# Patient Record
Sex: Female | Born: 1990 | Race: White | Hispanic: No | Marital: Married | Smoking: Never smoker
Health system: Southern US, Community
[De-identification: ages and names within clinical notes are randomized; demographics above are authoritative.]

## PROBLEM LIST (undated history)

## (undated) DIAGNOSIS — K219 Gastro-esophageal reflux disease without esophagitis: Secondary | ICD-10-CM

## (undated) HISTORY — PX: TONSILLECTOMY AND ADENOIDECTOMY: SUR1326

## (undated) HISTORY — DX: Gastro-esophageal reflux disease without esophagitis: K21.9

---

## 2014-11-30 ENCOUNTER — Encounter: Payer: Self-pay | Admitting: Internal Medicine

## 2014-11-30 ENCOUNTER — Ambulatory Visit (INDEPENDENT_AMBULATORY_CARE_PROVIDER_SITE_OTHER): Payer: 59 | Admitting: Internal Medicine

## 2014-11-30 VITALS — BP 140/90 | HR 75 | Temp 97.8°F | Resp 18 | Ht 62.0 in | Wt 203.0 lb

## 2014-11-30 DIAGNOSIS — K219 Gastro-esophageal reflux disease without esophagitis: Secondary | ICD-10-CM | POA: Insufficient documentation

## 2014-11-30 DIAGNOSIS — M542 Cervicalgia: Secondary | ICD-10-CM

## 2014-11-30 DIAGNOSIS — Z0000 Encounter for general adult medical examination without abnormal findings: Secondary | ICD-10-CM

## 2014-11-30 DIAGNOSIS — E669 Obesity, unspecified: Secondary | ICD-10-CM

## 2014-11-30 MED ORDER — DICLOFENAC SODIUM 1 % TD GEL: 2.0000 g | Freq: Three times a day (TID) | TRANSDERMAL | Status: DC | PRN

## 2014-11-30 NOTE — Patient Instructions (Signed)
We have checked an EKG and your heart looks normal. We will send in a medicine that you can rub on the spots that hurt. It is called voltaren and you can use it 2-3 times per day if needed.   We will also have you work on stretching exercises for the muscles in your shoulder and neck to help you from getting more of these pains.   Shoulder Exercises EXERCISES  RANGE OF MOTION (ROM) AND STRETCHING EXERCISES These exercises may help you when beginning to rehabilitate your injury. Your symptoms may resolve with or without further involvement from your physician, physical therapist or athletic trainer. While completing these exercises, remember:   Restoring tissue flexibility helps normal motion to return to the joints. This allows healthier, less painful movement and activity.  An effective stretch should be held for at least 30 seconds.  A stretch should never be painful. You should only feel a gentle lengthening or release in the stretched tissue. ROM - Pendulum  Bend at the waist so that your right / left arm falls away from your body. Support yourself with your opposite hand on a solid surface, such as a table or a countertop.  Your right / left arm should be perpendicular to the ground. If it is not perpendicular, you need to lean over farther. Relax the muscles in your right / left arm and shoulder as much as possible.  Gently sway your hips and trunk so they move your right / left arm without any use of your right / left shoulder muscles.  Progress your movements so that your right / left arm moves side to side, then forward and backward, and finally, both clockwise and counterclockwise.  Complete __________ repetitions in each direction. Many people use this exercise to relieve discomfort in their shoulder as well as to gain range of motion. Repeat __________ times. Complete this exercise __________ times per day. STRETCH - Flexion, Standing  Stand with good posture. With an  underhand grip on your right / left hand and an overhand grip on the opposite hand, grasp a broomstick or cane so that your hands are a little more than shoulder-width apart.  Keeping your right / left elbow straight and shoulder muscles relaxed, push the stick with your opposite hand to raise your right / left arm in front of your body and then overhead. Raise your arm until you feel a stretch in your right / left shoulder, but before you have increased shoulder pain.  Try to avoid shrugging your right / left shoulder as your arm rises by keeping your shoulder blade tucked down and toward your mid-back spine. Hold __________ seconds.  Slowly return to the starting position. Repeat __________ times. Complete this exercise __________ times per day. STRETCH - Internal Rotation  Place your right / left hand behind your back, palm-up.  Throw a towel or belt over your opposite shoulder. Grasp the towel/belt with your right / left hand.  While keeping an upright posture, gently pull up on the towel/belt until you feel a stretch in the front of your right / left shoulder.  Avoid shrugging your right / left shoulder as your arm rises by keeping your shoulder blade tucked down and toward your mid-back spine.  Hold __________. Release the stretch by lowering your opposite hand. Repeat __________ times. Complete this exercise __________ times per day. STRETCH - External Rotation and Abduction  Stagger your stance through a doorframe. It does not matter which foot is forward.  As instructed by your physician, physical therapist or athletic trainer, place your hands:  And forearms above your head and on the door frame.  And forearms at head-height and on the door frame.  At elbow-height and on the door frame.  Keeping your head and chest upright and your stomach muscles tight to prevent over-extending your low-back, slowly shift your weight onto your front foot until you feel a stretch across your  chest and/or in the front of your shoulders.  Hold __________ seconds. Shift your weight to your back foot to release the stretch. Repeat __________ times. Complete this stretch __________ times per day.  STRENGTHENING EXERCISES  These exercises may help you when beginning to rehabilitate your injury. They may resolve your symptoms with or without further involvement from your physician, physical therapist or athletic trainer. While completing these exercises, remember:   Muscles can gain both the endurance and the strength needed for everyday activities through controlled exercises.  Complete these exercises as instructed by your physician, physical therapist or athletic trainer. Progress the resistance and repetitions only as guided.  You may experience muscle soreness or fatigue, but the pain or discomfort you are trying to eliminate should never worsen during these exercises. If this pain does worsen, stop and make certain you are following the directions exactly. If the pain is still present after adjustments, discontinue the exercise until you can discuss the trouble with your clinician.  If advised by your physician, during your recovery, avoid activity or exercises which involve actions that place your right / left hand or elbow above your head or behind your back or head. These positions stress the tissues which are trying to heal. STRENGTH - Scapular Depression and Adduction  With good posture, sit on a firm chair. Supported your arms in front of you with pillows, arm rests or a table top. Have your elbows in line with the sides of your body.  Gently draw your shoulder blades down and toward your mid-back spine. Gradually increase the tension without tensing the muscles along the top of your shoulders and the back of your neck.  Hold for __________ seconds. Slowly release the tension and relax your muscles completely before completing the next repetition.  After you have practiced  this exercise, remove the arm support and complete it in standing as well as sitting. Repeat __________ times. Complete this exercise __________ times per day.  STRENGTH - External Rotators  Secure a rubber exercise band/tubing to a fixed object so that it is at the same height as your right / left elbow when you are standing or sitting on a firm surface.  Stand or sit so that the secured exercise band/tubing is at your side that is not injured.  Bend your elbow 90 degrees. Place a folded towel or small pillow under your right / left arm so that your elbow is a few inches away from your side.  Keeping the tension on the exercise band/tubing, pull it away from your body, as if pivoting on your elbow. Be sure to keep your body steady so that the movement is only coming from your shoulder rotating.  Hold __________ seconds. Release the tension in a controlled manner as you return to the starting position. Repeat __________ times. Complete this exercise __________ times per day.  STRENGTH - Supraspinatus  Stand or sit with good posture. Grasp a __________ weight or an exercise band/tubing so that your hand is "thumbs-up," like when you shake hands.  Slowly lift your  right / left hand from your thigh into the air, traveling about 30 degrees from straight out at your side. Lift your hand to shoulder height or as far as you can without increasing any shoulder pain. Initially, many people do not lift their hands above shoulder height.  Avoid shrugging your right / left shoulder as your arm rises by keeping your shoulder blade tucked down and toward your mid-back spine.  Hold for __________ seconds. Control the descent of your hand as you slowly return to your starting position. Repeat __________ times. Complete this exercise __________ times per day.  STRENGTH - Shoulder Extensors  Secure a rubber exercise band/tubing so that it is at the height of your shoulders when you are either standing or  sitting on a firm arm-less chair.  With a thumbs-up grip, grasp an end of the band/tubing in each hand. Straighten your elbows and lift your hands straight in front of you at shoulder height. Step back away from the secured end of band/tubing until it becomes tense.  Squeezing your shoulder blades together, pull your hands down to the sides of your thighs. Do not allow your hands to go behind you.  Hold for __________ seconds. Slowly ease the tension on the band/tubing as you reverse the directions and return to the starting position. Repeat __________ times. Complete this exercise __________ times per day.  STRENGTH - Scapular Retractors  Secure a rubber exercise band/tubing so that it is at the height of your shoulders when you are either standing or sitting on a firm arm-less chair.  With a palm-down grip, grasp an end of the band/tubing in each hand. Straighten your elbows and lift your hands straight in front of you at shoulder height. Step back away from the secured end of band/tubing until it becomes tense.  Squeezing your shoulder blades together, draw your elbows back as you bend them. Keep your upper arm lifted away from your body throughout the exercise.  Hold __________ seconds. Slowly ease the tension on the band/tubing as you reverse the directions and return to the starting position. Repeat __________ times. Complete this exercise __________ times per day. STRENGTH - Scapular Depressors  Find a sturdy chair without wheels, such as a from a dining room table.  Keeping your feet on the floor, lift your bottom from the seat and lock your elbows.  Keeping your elbows straight, allow gravity to pull your body weight down. Your shoulders will rise toward your ears.  Raise your body against gravity by drawing your shoulder blades down your back, shortening the distance between your shoulders and ears. Although your feet should always maintain contact with the floor, your feet should  progressively support less body weight as you get stronger.  Hold __________ seconds. In a controlled and slow manner, lower your body weight to begin the next repetition. Repeat __________ times. Complete this exercise __________ times per day.  Document Released: 10/03/2005 Document Revised: 02/11/2012 Document Reviewed: 03/03/2009 [facility] Patient Information 2015 [place], [place]. This information is not intended to replace advice given to you by your health care provider. Make sure you discuss any questions you have with your health care provider.

## 2014-11-30 NOTE — Progress Notes (Signed)
   Subjective:    Patient ID: Julia Weeks, female    DOB: Nov 06, 1991, 23 y.o.   MRN: [id number]  HPI The patient is a 23 YO female who is coming in to establish care. She is also having an acute problem with some pains that start in her neck and then radiate into her left side. She does not notice any correlation with activity. She denies pains in her chest although sometimes she has some pain under her breasts along her bra line. She thinks the pains are sharp. She denies any new activities or heavy lifting or accident prior to the start of the pains. They do not happen every day and they do not last all day. She has not tried anything for them over the counter. She went to another provided and she was not pleased with their evaluation of it. She denies other problems and no family history of cardiac problems. She works out 3-4 days per week and the pain is not exacerbated by the activity.   Review of Systems  Constitutional: Negative for fever, activity change, appetite change, fatigue and unexpected weight change.  HENT: Negative.   Respiratory: Negative for cough, chest tightness, shortness of breath and wheezing.   Cardiovascular: Negative for chest pain, palpitations and leg swelling.  Gastrointestinal: Negative.   Endocrine: Negative.   Musculoskeletal: Positive for myalgias and neck pain. Negative for back pain, arthralgias, gait problem and neck stiffness.  Skin: Negative.   Neurological: Negative.       Objective:   Physical Exam  Constitutional: She is oriented to person, place, and time. She appears well-developed and well-nourished.  HENT:  Head: Normocephalic and atraumatic.  Eyes: Pupils are equal, round, and reactive to light.  Neck: Normal range of motion.  Cardiovascular: Normal rate and regular rhythm.   No murmur heard. Pulmonary/Chest: Effort normal and breath sounds normal. No respiratory distress. She has no wheezes. She has no rales.  Abdominal: Soft. Bowel  sounds are normal.  Musculoskeletal: Normal range of motion.  No tenderness in the back. Mild tenderness the palpation along the [facility] joint and lower neck. No radiation of the tenderness.   Neurological: She is alert and oriented to person, place, and time. Coordination normal.  Skin: Skin is warm and dry.   Filed Vitals:   11/30/14 0959  BP: 140/90  Pulse: 75  Temp: 97.8 F (36.6 C)  Resp: 18      Assessment & Plan:

## 2014-11-30 NOTE — Assessment & Plan Note (Signed)
Regular periods, last PAP may 2015. Exercises 3-4 times weekly and non-smoker.

## 2014-11-30 NOTE — Progress Notes (Signed)
Pre visit review using our clinic review tool, if applicable. No additional management support is needed unless otherwise documented below in the visit note. 

## 2014-11-30 NOTE — Assessment & Plan Note (Signed)
Likely musculoskeletal in origin. Check EKG given her concern and proximity to left side. Rx for voltaren gel and stretching exercises for her shoulder and neck advised.

## 2014-11-30 NOTE — Assessment & Plan Note (Signed)
She uses zantac prn for heartburn. No indication for further workup.

## 2014-12-10 ENCOUNTER — Other Ambulatory Visit: Payer: Self-pay | Admitting: Internal Medicine

## 2014-12-10 MED ORDER — LIDOCAINE 5 % EX PTCH
1.0000 | MEDICATED_PATCH | CUTANEOUS | Status: DC
Start: 2014-12-10 — End: 2016-08-25

## 2014-12-20 ENCOUNTER — Emergency Department (HOSPITAL_COMMUNITY)
Admission: EM | Admit: 2014-12-20 | Discharge: 2014-12-20 | Disposition: A | Discharge status: Home / Self Care | Payer: 59 | Attending: Emergency Medicine | Admitting: Emergency Medicine

## 2014-12-20 ENCOUNTER — Encounter (HOSPITAL_COMMUNITY): Payer: Self-pay | Admitting: Emergency Medicine

## 2014-12-20 DIAGNOSIS — R42 Dizziness and giddiness: Secondary | ICD-10-CM | POA: Diagnosis not present

## 2014-12-20 DIAGNOSIS — Z8719 Personal history of other diseases of the digestive system: Secondary | ICD-10-CM | POA: Diagnosis not present

## 2014-12-20 DIAGNOSIS — Z3202 Encounter for pregnancy test, result negative: Secondary | ICD-10-CM | POA: Insufficient documentation

## 2014-12-20 DIAGNOSIS — H9312 Tinnitus, left ear: Secondary | ICD-10-CM | POA: Insufficient documentation

## 2014-12-20 DIAGNOSIS — R110 Nausea: Secondary | ICD-10-CM | POA: Insufficient documentation

## 2014-12-20 LAB — URINE MICROSCOPIC-ADD ON

## 2014-12-20 LAB — URINALYSIS, ROUTINE W REFLEX MICROSCOPIC
BILIRUBIN URINE: NEGATIVE
Glucose, UA: NEGATIVE mg/dL
Hgb urine dipstick: NEGATIVE
Ketones, ur: NEGATIVE mg/dL
Nitrite: NEGATIVE
PROTEIN: NEGATIVE mg/dL
SPECIFIC GRAVITY, URINE: 1.021 (ref 1.005–1.030)
Urobilinogen, UA: 0.2 mg/dL (ref 0.0–1.0)
pH: 6 (ref 5.0–8.0)

## 2014-12-20 LAB — I-STAT CHEM 8, ED
BUN: 11 mg/dL (ref 6–23)
CALCIUM ION: 1.25 mmol/L — AB (ref 1.12–1.23)
Chloride: 101 mEq/L (ref 96–112)
Creatinine, Ser: 0.8 mg/dL (ref 0.50–1.10)
Glucose, Bld: 104 mg/dL — ABNORMAL HIGH (ref 70–99)
HEMATOCRIT: 44 % (ref 36.0–46.0)
Hemoglobin: 15 g/dL (ref 12.0–15.0)
Potassium: 3.5 mmol/L (ref 3.5–5.1)
SODIUM: 142 mmol/L (ref 135–145)
TCO2: 24 mmol/L (ref 0–100)

## 2014-12-20 LAB — PREGNANCY, URINE: PREG TEST UR: NEGATIVE

## 2014-12-20 MED ORDER — MECLIZINE HCL 25 MG PO TABS
50.0000 mg | ORAL_TABLET | Freq: Once | ORAL | Status: AC
  Administered 2014-12-20: 50 mg via ORAL
  Filled 2014-12-20: qty 2

## 2014-12-20 MED ORDER — ONDANSETRON 4 MG PO TBDP: ORAL_TABLET | ORAL | Status: DC

## 2014-12-20 MED ORDER — SODIUM CHLORIDE 0.9 % IV BOLUS (SEPSIS)
1000.0000 mL | Freq: Once | INTRAVENOUS | Status: AC
  Administered 2014-12-20: 1000 mL via INTRAVENOUS

## 2014-12-20 MED ORDER — LORAZEPAM 2 MG/ML IJ SOLN
0.5000 mg | Freq: Once | INTRAMUSCULAR | Status: AC
  Administered 2014-12-20: 0.5 mg via INTRAVENOUS
  Filled 2014-12-20: qty 1

## 2014-12-20 MED ORDER — MECLIZINE HCL 25 MG PO TABS: 25.0000 mg | ORAL_TABLET | Freq: Three times a day (TID) | ORAL | Status: DC | PRN

## 2014-12-20 NOTE — ED Provider Notes (Signed)
CSN: [id number]     Arrival date & time 12/20/14  0023 History   First MD Initiated Contact with Patient 12/20/14 0138     Chief Complaint  Patient presents with  . Dizziness     (Consider location/radiation/quality/duration/timing/severity/associated sxs/prior Treatment) HPI Patient presents with 3 days of tingling sensation to the left parietal scalp and neck. She also complains of dizziness worse with head movement and change positions. She's had some mild nausea. Complains of ringing in the left ear. Denies recent URI, fever. 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 This is unchanged. No focal weakness or numbness. Past Medical History  Diagnosis Date  . GERD (gastroesophageal reflux disease)    Past Surgical History  Procedure Laterality Date  . Tonsillectomy and adenoidectomy     Family History  Problem Relation Age of Onset  . Arthritis Mother   . Arthritis Father   . Diabetes Father   . Arthritis Maternal Grandmother   . Arthritis Maternal Grandfather   . Diabetes Paternal Grandmother    History  Substance Use Topics  . Smoking status: Never Smoker   . Smokeless tobacco: Not on file  . Alcohol Use: 0.0 oz/week    0 Not specified per week   OB History    No data available     Review of Systems  Constitutional: Negative for fever and chills.  HENT: Negative for congestion, ear pain, hearing loss, sinus pressure and sore throat.   Eyes: Negative for photophobia.  Respiratory: Negative for shortness of breath.   Cardiovascular: Negative for chest pain.  Gastrointestinal: Positive for nausea. Negative for vomiting, abdominal pain and diarrhea.  Musculoskeletal: Negative for neck pain and neck stiffness.  Skin: Negative for rash and wound.  Neurological: Positive for dizziness and light-headedness. Negative for weakness, numbness and headaches.  All other systems reviewed and are negative.     Allergies  Review of patient's allergies  indicates no known allergies.  Home Medications   Prior to Admission medications   Medication Sig Start Date End Date Taking? Authorizing Provider  diclofenac sodium (VOLTAREN) 1 % GEL Apply 2 g topically 3 (three) times daily as needed. Patient not taking: Reported on 12/20/2014 11/30/14   Judie BonusElizabeth A Kollar, MD  lidocaine (LIDODERM) 5 % Place 1 patch onto the skin daily. Remove & Discard patch within 12 hours or as directed by MD Patient not taking: Reported on 12/20/2014 12/10/14   Judie BonusElizabeth A Kollar, MD  meclizine (ANTIVERT) 25 MG tablet Take 1 tablet (25 mg total) by mouth 3 (three) times daily as needed for dizziness or nausea. 12/20/14   Loren Raceravid Estalee Mccandlish, MD  ondansetron (ZOFRAN ODT) 4 MG disintegrating tablet 4mg  ODT q4 hours prn nausea/vomit 12/20/14   Loren Raceravid Stella Bortle, MD   BP 114/79 mmHg  Pulse 86  Temp(Src) 97.5 F (36.4 C) (Oral)  Resp 20  SpO2 99%  LMP 11/26/2014 (Approximate) Physical Exam  Constitutional: She is oriented to person, place, and time. She appears well-developed and well-nourished. No distress.  HENT:  Head: Normocephalic and atraumatic.  Mouth/Throat: Oropharynx is clear and moist.  Bilateral TMs without effusion, erythema  Eyes: EOM are normal. Pupils are equal, round, and reactive to light.  Non-fatigable nystagmus present  Neck: Normal range of motion. Neck supple.  No meningismus  Cardiovascular: Normal rate and regular rhythm.   Pulmonary/Chest: Effort normal and breath sounds normal. No respiratory distress. She has no wheezes. She has no rales.  Abdominal: Soft. Bowel sounds are normal.  She exhibits no distension and no mass. There is no tenderness. There is no rebound and no guarding.  Musculoskeletal: Normal range of motion. She exhibits no edema or tenderness.  Neurological: She is alert and oriented to person, place, and time.  Patient is alert and oriented x3 with clear, goal oriented speech. Patient has 5/5 motor in all extremities. Sensation is  intact to light touch. Bilateral finger-to-nose is normal with no signs of dysmetria. Patient has a normal gait and walks without assistance.  Skin: Skin is warm and dry. No rash noted. No erythema.  Psychiatric: She has a normal mood and affect. Her behavior is normal.  Nursing note and vitals reviewed.   ED Course  Procedures (including critical care time) Labs Review Labs Reviewed  URINALYSIS, ROUTINE W REFLEX MICROSCOPIC - Abnormal; Notable for the following:    Leukocytes, UA TRACE (*)    All other components within normal limits  I-STAT CHEM 8, ED - Abnormal; Notable for the following:    Glucose, Bld 104 (*)    Calcium, Ion 1.25 (*)    All other components within normal limits  PREGNANCY, URINE  URINE MICROSCOPIC-ADD ON    Imaging Review No results found.   EKG Interpretation None      MDM   Final diagnoses:  Dizziness  Tinnitus of left ear    Patient states dizziness has improved with medications and IV fluids. She's had been living without assistance. 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 She's been advised to follow-up with her primary physician and return precautions have been given.    Loren Racer, MD 12/20/14 [phone]

## 2014-12-20 NOTE — ED Notes (Signed)
Pt arrived to the ED with a complaint of "tingling' in her head and neck area.  Pt has experienced symptoms for over a month but tonight the sensation progressed to her head. Pt states she feels dizzy as well.

## 2014-12-20 NOTE — Discharge Instructions (Signed)
Follow-up with your primary physician. Should your symptoms persist she may need to see a neurologist. Return immediately for worsening dizziness, focal weakness, visual changes or for any concerns.  Dizziness Dizziness is a common problem. It is a feeling of unsteadiness or light-headedness. You may feel like you are about to faint. Dizziness can lead to injury if you stumble or fall. A person of any age group can suffer from dizziness, but dizziness is more common in older adults. CAUSES  Dizziness can be caused by many different things, including:  Middle ear problems.  Standing for too long.  Infections.  An allergic reaction.  Aging.  An emotional response to something, such as the sight of blood.  Side effects of medicines.  Tiredness.  Problems with circulation or blood pressure.  Excessive use of alcohol or medicines, or illegal drug use.  Breathing too fast (hyperventilation).  An irregular heart rhythm (arrhythmia).  A low red blood cell count (anemia).  Pregnancy.  Vomiting, diarrhea, fever, or other illnesses that cause body fluid loss (dehydration).  Diseases or conditions such as Parkinson's disease, high blood pressure (hypertension), diabetes, and thyroid problems.  Exposure to extreme heat. DIAGNOSIS  Your health care provider will ask about your symptoms, perform a physical exam, and perform an electrocardiogram (ECG) to record the electrical activity of your heart. Your health care provider may also perform other heart or blood tests to determine the cause of your dizziness. These may include:  Transthoracic echocardiogram (TTE). During echocardiography, sound waves are used to evaluate how blood flows through your heart.  Transesophageal echocardiogram (TEE).  Cardiac monitoring. This allows your health care provider to monitor your heart rate and rhythm in real time.  Holter monitor. This is a portable device that records your heartbeat and can  help diagnose heart arrhythmias. It allows your health care provider to track your heart activity for several days if needed.  Stress tests by exercise or by giving medicine that makes the heart beat faster. TREATMENT  Treatment of dizziness depends on the cause of your symptoms and can vary greatly. HOME CARE INSTRUCTIONS   Drink enough fluids to keep your urine clear or pale yellow. This is especially important in very hot weather. In older adults, it is also important in cold weather.  Take your medicine exactly as directed if your dizziness is caused by medicines. When taking blood pressure medicines, it is especially important to get up slowly.  Rise slowly from chairs and steady yourself until you feel okay.  In the morning, first sit up on the side of the bed. When you feel okay, stand slowly while holding onto something until you know your balance is fine.  Move your legs often if you need to stand in one place for a long time. Tighten and relax your muscles in your legs while standing.  Have someone stay with you for 1-2 days if dizziness continues to be a problem. Do this until you feel you are well enough to stay alone. Have the person call your health care provider if he or she notices changes in you that are concerning.  Do not drive or use heavy machinery if you feel dizzy.  Do not drink alcohol. SEEK IMMEDIATE MEDICAL CARE IF:   Your dizziness or light-headedness gets worse.  You feel nauseous or vomit.  You have problems talking, walking, or using your arms, hands, or legs.  You feel weak.  You are not thinking clearly or you have trouble forming sentences.  It may take a friend or family member to notice this.  You have chest pain, abdominal pain, shortness of breath, or sweating.  Your vision changes.  You notice any bleeding.  You have side effects from medicine that seems to be getting worse rather than better. MAKE SURE YOU:   Understand these  instructions.  Will watch your condition.  Will get help right away if you are not doing well or get worse. Document Released: 05/15/2001 Document Revised: 11/24/2013 Document Reviewed: 06/08/2011 [facility] Patient Information 2015 [place], [place]. This information is not intended to replace advice given to you by your health care provider. Make sure you discuss any questions you have with your health care provider.

## 2015-01-12 ENCOUNTER — Ambulatory Visit (INDEPENDENT_AMBULATORY_CARE_PROVIDER_SITE_OTHER): Payer: 59 | Admitting: Internal Medicine

## 2015-01-12 ENCOUNTER — Encounter: Payer: Self-pay | Admitting: Internal Medicine

## 2015-01-12 VITALS — BP 116/86 | HR 73 | Temp 97.9°F | Resp 14 | Wt 208.0 lb

## 2015-01-12 DIAGNOSIS — R51 Headache: Secondary | ICD-10-CM

## 2015-01-12 DIAGNOSIS — R519 Headache, unspecified: Secondary | ICD-10-CM

## 2015-01-12 DIAGNOSIS — M542 Cervicalgia: Secondary | ICD-10-CM

## 2015-01-12 NOTE — Patient Instructions (Signed)
We will get an imaging test of the brain and the upper neck to see if we can find a cause for your pain. We will also send you to the neurologist to see if they can help us find the cause.

## 2015-01-12 NOTE — Progress Notes (Signed)
Pre visit review using our clinic review tool, if applicable. No additional management support is needed unless otherwise documented below in the visit note. 

## 2015-01-13 NOTE — Assessment & Plan Note (Signed)
Will check MRI head and neck given the headaches associated and to rule out neurologic inpingement. She does appear fairly comfortable in the exam room. Trial lidoderm patches for the pain as it is very localized on exam to the shoulder region. Referral to neurology placed today per patient request although she does not have signs of neurological changes on exam.

## 2015-01-13 NOTE — Progress Notes (Signed)
   Subjective:    Patient ID: Julia Weeks, female    DOB: January 31, 1991, 24 y.o.   MRN: [id number]  HPI The patient is a 24 YO female who is coming in to follow up on her neck pain. She tried the voltaren gel and did not get relief. She is still having the pain in her shoulder and neck which radiates into her left side. She denies specific chest pains or SOB. She still denies injury to the area. She has not worked on stretching exercises. She still rates the pain as 10/10 all the time with 6/10 the best she can get it. She wishes to see a neurologist. She denies weakness in her hand or leg. She has neck pain and it is hard to tell if she is having that pain or separate headaches. She is concerned about aneurysms and the length of the pain. It has now been going on for 4-5 months.   Review of Systems  Constitutional: Negative for fever, activity change, appetite change, fatigue and unexpected weight change.  Respiratory: Negative for cough, chest tightness, shortness of breath and wheezing.   Cardiovascular: Negative for chest pain, palpitations and leg swelling.  Gastrointestinal: Negative.   Endocrine: Negative.   Musculoskeletal: Positive for myalgias and neck pain. Negative for back pain, arthralgias, gait problem and neck stiffness.  Skin: Negative.   Neurological: Positive for headaches. Negative for dizziness, weakness, light-headedness and numbness.      Objective:   Physical Exam  Constitutional: She is oriented to person, place, and time. She appears well-developed and well-nourished.  Sitting comfortably  HENT:  Head: Normocephalic and atraumatic.  Eyes: Pupils are equal, round, and reactive to light.  Neck: Normal range of motion.  Cardiovascular: Normal rate and regular rhythm.   No murmur heard. Pulmonary/Chest: Effort normal and breath sounds normal. No respiratory distress. She has no wheezes. She has no rales.  Abdominal: Soft. Bowel sounds are normal.  Musculoskeletal:  Normal range of motion.  No tenderness in the back. Mild tenderness the palpation along the [facility] joint and lower neck. No radiation of the tenderness.   Neurological: She is alert and oriented to person, place, and time. Coordination normal.  Skin: Skin is warm and dry.   Filed Vitals:   01/12/15 0826  BP: 116/86  Pulse: 73  Temp: 97.9 F (36.6 C)  TempSrc: Oral  Resp: 14  Weight: 208 lb (94.348 kg)  SpO2: 98%      Assessment & Plan:

## 2015-01-26 ENCOUNTER — Ambulatory Visit
Admission: RE | Admit: 2015-01-26 | Discharge: 2015-01-26 | Disposition: A | Discharge status: Home / Self Care | Payer: 59 | Source: Ambulatory Visit | Attending: Internal Medicine | Admitting: Internal Medicine

## 2015-01-26 DIAGNOSIS — R519 Headache, unspecified: Secondary | ICD-10-CM

## 2015-01-26 DIAGNOSIS — M542 Cervicalgia: Secondary | ICD-10-CM

## 2015-01-26 DIAGNOSIS — R51 Headache: Principal | ICD-10-CM

## 2015-03-04 ENCOUNTER — Ambulatory Visit (INDEPENDENT_AMBULATORY_CARE_PROVIDER_SITE_OTHER): Payer: 59 | Admitting: Neurology

## 2015-03-04 ENCOUNTER — Encounter: Payer: Self-pay | Admitting: Neurology

## 2015-03-04 VITALS — BP 126/70 | HR 70 | Temp 98.2°F | Resp 20 | Ht 62.0 in | Wt 208.2 lb

## 2015-03-04 DIAGNOSIS — R252 Cramp and spasm: Secondary | ICD-10-CM | POA: Diagnosis not present

## 2015-03-04 DIAGNOSIS — R208 Other disturbances of skin sensation: Secondary | ICD-10-CM | POA: Diagnosis not present

## 2015-03-04 DIAGNOSIS — R200 Anesthesia of skin: Secondary | ICD-10-CM

## 2015-03-04 DIAGNOSIS — M542 Cervicalgia: Secondary | ICD-10-CM | POA: Diagnosis not present

## 2015-03-04 LAB — CK: Total CK: 40 U/L (ref 7–177)

## 2015-03-04 LAB — MAGNESIUM: MAGNESIUM: 2 mg/dL (ref 1.5–2.5)

## 2015-03-04 NOTE — Progress Notes (Signed)
NEUROLOGY CONSULTATION NOTE  Julia Weeks MRN: [id number] DOB: July 21, 1991  Referring provider: Dr. Dorise Hiss Primary care provider: Dr. Dorise Hiss  Reason for consult:  Neck pain and arm numbness.  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  Records, labs and MRI of brain and cervical spine reviewed.  About 3 months ago, she developed gradual left sided neck pain that radiated into the left shoulder.  It progressed to involve left side of her back as well.  She later developed numbness and tingling involving the left hand and forearm.  The numbness is noticeable in the morning when she wakes up in bed.  She also notices it while holding and talking on the phone.  She denies radicular pain down the arm.  She feels that her hand and arm are a little weak.  She has difficulty opening jars.  The neck pain hurst when she is laying in bed.  She denies any preceding trigger such as injury or strenuous activity.  She denies any new visual disturbance.  She denies headache.  About a month ago, she began noticing some tingling and cramping in her calves.  She says that her legs feel a little weak as well.  She notes some tenderness in her coccyx, but denies low back pain or pain down the legs.  She always feels like she needs to stretch her legs.  She denies restless leg symptoms at night.  She did have an episode of dizziness and tinnitus in left ear from January.  Chem-8 was unremarkable.  She reports history of dizziness from time to time.  She had MRI of the brain on 01/26/15, which was unremarkable.  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  PAST MEDICAL HISTORY: Past Medical History  Diagnosis Date  . GERD (gastroesophageal reflux disease)     PAST SURGICAL HISTORY: Past Surgical History  Procedure  Laterality Date  . Tonsillectomy and adenoidectomy      MEDICATIONS: Current Outpatient Prescriptions on File Prior to Visit  Medication Sig Dispense Refill  . diclofenac sodium (VOLTAREN) 1 % GEL Apply 2 g topically 3 (three) times daily as needed. (Patient not taking: Reported on 03/04/2015) 100 g 0  . lidocaine (LIDODERM) 5 % Place 1 patch onto the skin daily. Remove & Discard patch within 12 hours or as directed by MD (Patient not taking: Reported on 03/04/2015) 30 patch 0  . meclizine (ANTIVERT) 25 MG tablet Take 1 tablet (25 mg total) by mouth 3 (three) times daily as needed for dizziness or nausea. (Patient not taking: Reported on 03/04/2015) 28 tablet 0  . ondansetron (ZOFRAN ODT) 4 MG disintegrating tablet  ODT q4 hours prn nausea/vomit (Patient not taking: Reported on 03/04/2015) 8 tablet 0   No current facility-administered medications on file prior to visit.    ALLERGIES: No Known Allergies  FAMILY HISTORY: Family History  Problem Relation Age of Onset  . Arthritis Mother   . Arthritis Father   . Diabetes Father   . Arthritis Maternal Grandmother   . Arthritis Maternal Grandfather   . Diabetes Paternal Grandmother     SOCIAL HISTORY: History   Social History  . Marital Status: Married    Spouse Name: N/A  . Number of Children: N/A  . Years of Education: N/A   Occupational History  . Not on  file.   Social History Main Topics  . Smoking status: Never Smoker   . Smokeless tobacco: Not on file  . Alcohol Use: 0.0 oz/week    0 Standard drinks or equivalent per week     Comment: socially   . Drug Use: No  . Sexual Activity:    Partners: Male   Other Topics Concern  . Not on file   Social History Narrative    REVIEW OF SYSTEMS: Constitutional: No fevers, chills, or sweats, no generalized fatigue, change in appetite Eyes: No visual changes, double vision, eye pain Ear, nose and throat: No hearing loss, ear pain, nasal congestion, sore throat Cardiovascular:  No chest pain, palpitations Respiratory:  No shortness of breath at rest or with exertion, wheezes GastrointestinaI: No nausea, vomiting, diarrhea, abdominal pain, fecal incontinence Genitourinary:  No dysuria, urinary retention or frequency Musculoskeletal:  No neck pain, back pain Integumentary: No rash, pruritus, skin lesions Neurological: as above Psychiatric: No depression, insomnia, anxiety Endocrine: No palpitations, fatigue, diaphoresis, mood swings, change in appetite, change in weight, increased thirst Hematologic/Lymphatic:  No anemia, purpura, petechiae. Allergic/Immunologic: no itchy/runny eyes, nasal congestion, recent allergic reactions, rashes  PHYSICAL EXAM: Filed Vitals:   03/04/15 0946  BP: 126/70  Pulse: 70  Temp: 98.2 F (36.8 C)  Resp: 20   General: No acute distress Head:  Normocephalic/atraumatic Eyes:  fundi unremarkable, without vessel changes, exudates, hemorrhages or papilledema. Neck: supple, no paraspinal tenderness, full range of motion Back: No paraspinal tenderness Heart: regular rate and rhythm Lungs: Clear to auscultation bilaterally. Vascular: No carotid bruits. Neurological Exam: Mental status: alert and oriented to person, place, and time, recent and remote memory intact, fund of knowledge intact, attention and concentration intact, speech fluent and not dysarthric, language intact. Cranial nerves: CN I: not tested CN II: pupils equal, round and reactive to light, visual fields intact, fundi unremarkable, without vessel changes, exudates, hemorrhages or papilledema. CN III, IV, VI:  full range of motion, horizontal nystagmus in all directions including orthophoric gaze, no ptosis CN V: facial sensation intact CN VII: upper and lower face symmetric CN VIII: hearing intact CN IX, X: gag intact, uvula midline CN XI: sternocleidomastoid and trapezius muscles intact CN XII: tongue midline Bulk & Tone: normal, no fasciculations. Motor:  5/5  throughout Sensation:  Endorses reduced pinprick sensation involving the dorsum of left hand, thenar and hypothenar eminence, all fingers, entire forearm and medial aspect of upper arm.  Vibration sensation intact. Deep Tendon Reflexes:  2+ throughout, toes downgoing. Finger to nose testing:  No dysmetria. Heel to shin:  No dysmetria Gait:  Normal station and stride.  Able to turn, walk on toes, heels and in tandem. Romberg negative. Positive Tinel's sign on left.  IMPRESSION: I don't appreciate any structural cause of her symptoms on MRI. Left sided neck pain into shoulder and back.  At this point, it seems more likely to be myofascial Left arm and hand numbness.  I suspect this is carpal tunnel syndrome and separate from the neck pain.    Numbness and muscle cramps in the legs. Congenital nystagmus  PLAN: 1.  Will refer to PT for neck pain 2.  Advised to wear a wrist splint at night 3.  Given the symptoms in the legs and to evaluate for any possible radiculopathy in the neck, will get NCV-EMG of the left upper and lower extremities 4.  Will check B12, TSH, CK and Mg 5.  Follow up afterwards.  Thank you for allowing me  to take part in the care of this patient.  Shon MilletAdam Vinson Tietze, DO  CC:  Genella MechElizabeth Kollar, MD

## 2015-03-04 NOTE — Patient Instructions (Addendum)
I think you more likely have a muscle strain in the neck and the numbness in the arm is due to carpal tunnel.  But we will perform a couple of more tests. 1.  Refer to physical therapy for the neck pain 2.  Will check some blood work for numbness and cramps, such as B12, TSH and Ck and magnesium level 3.  Will get a nerve conduction study of the left arm and left leg for neck pain, left arm numbness and bilateral leg numbness 4.  Follow up after round of physical therapy and nerve conduction study.  5. Wear Lt wrist splint at night may be purchased at Drug store or [facility] .

## 2015-03-05 LAB — TSH: TSH: 6.912 u[IU]/mL — AB (ref 0.350–4.500)

## 2015-03-05 LAB — VITAMIN B12: Vitamin B-12: 1060 pg/mL — ABNORMAL HIGH (ref 211–911)

## 2015-03-10 ENCOUNTER — Other Ambulatory Visit: Payer: Self-pay | Admitting: *Deleted

## 2015-03-10 DIAGNOSIS — M542 Cervicalgia: Secondary | ICD-10-CM

## 2015-03-10 DIAGNOSIS — R202 Paresthesia of skin: Principal | ICD-10-CM

## 2015-03-10 DIAGNOSIS — R200 Anesthesia of skin: Secondary | ICD-10-CM

## 2015-03-10 LAB — T3, FREE: T3 FREE: 3.2 pg/mL (ref 2.3–4.2)

## 2015-03-10 LAB — T3: T3 TOTAL: 120.7 ng/dL (ref 80.0–204.0)

## 2015-03-10 LAB — T4, FREE: Free T4: 1.01 ng/dL (ref 0.80–1.80)

## 2015-03-11 ENCOUNTER — Telehealth: Payer: Self-pay | Admitting: *Deleted

## 2015-03-11 NOTE — Telephone Encounter (Signed)
-----   Message from Drema DallasAdam R Jaffe, DO sent at 03/11/2015  6:41 AM EDT ----- Although the TSH was mildly elevated, the thyroid hormones are normal.  So this may be of uncertain clinical significance but I would address this with her PCP for monitoring. ----- Message -----    From: [facility] in [facility] Interface    Sent: 03/10/2015  10:35 PM      To: Drema DallasAdam R Jaffe, DO

## 2015-03-11 NOTE — Telephone Encounter (Signed)
Patient is aware of labs and was advised to make pcp aware if follow up is needed

## 2015-03-14 ENCOUNTER — Ambulatory Visit (INDEPENDENT_AMBULATORY_CARE_PROVIDER_SITE_OTHER): Payer: 59 | Admitting: Neurology

## 2015-03-14 DIAGNOSIS — R200 Anesthesia of skin: Secondary | ICD-10-CM

## 2015-03-14 DIAGNOSIS — R252 Cramp and spasm: Secondary | ICD-10-CM

## 2015-03-14 DIAGNOSIS — M542 Cervicalgia: Secondary | ICD-10-CM

## 2015-03-14 NOTE — Procedures (Signed)
[facility] Neurology  [address] [place], [address]  [place], [place] [address] Tel: [phone] Fax:  [phone] Test Date:  03/14/2015  Patient: Julia Weeks DOB: 03-10-1991 Physician: Nita Sickle  Sex: Female Height:  Ref Phys: Shon Millet  ID#: [id number]   Technician: Ala Bent R. NCS T.   Patient Complaints: Patient is a 24 year old female here for evaluation of numbness and muscle cramps in her legs as well as left hand and arm numbness.  NCV & EMG Findings: Extensive electrodiagnostic testing of the left upper and lower extremity shows:  1. Left median, ulnar, radial, and palmar sensory responses are within normal limits. 2. Left median and ulnar motor responses are within normal limits. 3. Left sural and superficial peroneal sensory responses are within normal limits. 4. Left peroneal motor responses are within normal limits. Bilateral tibial motor responses are reduced in amplitude and despite maximal stimulation at the popliteal fossa, motor response was not maximally obtained. These findings are may be technically nature as well as structural due to a large callus overlying this muscle secondary to pes planus. 5. There is no evidence of active or chronic motor axonal loss changes affecting any of the tested muscles. Motor unit configuration is within normal limits.   Impression: This is essentially a normal study of the left side. Bilateral tibial motor responses are reduced in amplitude and is most likely due to callus formation overlying this muscle, less likely and S1 radiculopathy.   There is no evidence of a generalized sensorimotor polyneuropathy, cervical/lumbosacral radiculopathy, or carpal tunnel syndrome affecting the left side.    ___________________________ Nita Sickle    Nerve Conduction Studies Anti Sensory Summary Table   Stim Site NR Peak (ms) Norm Peak (ms) P-T Amp (V) Norm P-T Amp  Left Median Anti Sensory (2nd Digit)  32C  Wrist     2.8 <3.3 67.6 >20  Left Sup Peroneal Anti Sensory (Ant Lat Mall)  32C  12 cm    2.5 <4.4 22.9 >6  Left Sural Anti Sensory (Lat Mall)  32C  Calf    3.4 <4.4 10.0 >6  Left Ulnar Anti Sensory (5th Digit)  32C  Wrist    2.8 <3.0 47.2 >18   Motor Summary Table   Stim Site NR Onset (ms) Norm Onset (ms) O-P Amp (mV) Norm O-P Amp Site1 Site2 Delta-0 (ms) Dist (cm) Vel (m/s) Norm Vel (m/s)  Left Median Motor (Abd Poll Brev)  32C  Wrist    3.1 <3.9 10.9 >6 Elbow Wrist 4.2 25.0 60 >51  Elbow    7.3  10.2         Left Peroneal Motor (Ext Dig Brev)  32C  Ankle    2.5 <5.5 5.5 >3 B Fib Ankle 7.0 35.5 51 >41  B Fib    9.5  5.1  Poplt B Fib 1.6 8.0 50 >41  Poplt    11.1  5.1         Left Tibial Motor (Abd Hall Brev)  32C    Body habitus behind knee  Ankle    4.5 <5.8 5.3 >8 Knee Ankle 7.7 41.0 53 >41  Knee    12.2  1.3         Right Tibial Motor (Abd Hall Brev)  32C    Multiple attempts at pop fossa  Ankle    5.1 <5.8 5.0 >8 Knee Ankle 8.7 41.0 47 >41  Knee    13.8  0.0  Left Ulnar Motor (Abd Dig Minimi)  32C  Wrist    2.4 <3.0 8.0 >8 B Elbow Wrist 3.3 20.0 61 >51  B Elbow    5.7  7.6  A Elbow B Elbow 1.8 10.0 56 >51  A Elbow    7.5  7.3          Comparison Summary Table   Stim Site NR Peak (ms) Norm Peak (ms) P-T Amp (V) Site1 Site2 Delta-P (ms) Norm Delta (ms)  Left Median/Ulnar Palm Comparison (Wrist - 8cm)  32C  Median Palm    1.7 <2.2 67.9 Median Palm Ulnar Palm 0.1   Ulnar Palm    1.6 <2.2 23.9       F Wave Studies   NR F-Lat (ms) Lat Norm (ms) L-R F-Lat (ms)  Left Ulnar (Mrkrs) (Abd Dig Min)  32C     26.02 <33    H Reflex Studies   NR H-Lat (ms) Lat Norm (ms) L-R H-Lat (ms)  Left Tibial (Gastroc)  32C     29.80 <35 0.27  Right Tibial (Gastroc)  32C     30.07 <35 0.27   EMG   Side Muscle Ins Act Fibs Psw Fasc Number Recrt Dur Dur. Amp Amp. Poly Poly. Comment  Left AntTibialis Nml Nml Nml Nml Nml Nml Nml Nml Nml Nml Nml Nml N/A  Left Gastroc Nml Nml Nml  Nml Nml Nml Nml Nml Nml Nml Nml Nml N/A  Left Flex Dig Long Nml Nml Nml Nml Nml Nml Nml Nml Nml Nml Nml Nml N/A  Left RectFemoris Nml Nml Nml Nml Nml Nml Nml Nml Nml Nml Nml Nml N/A  Left AbductorHallicis Nml Nml Nml Nml 1- Mod-R Some 1+ Some 1+ Nml Nml N/A  Left BicepsFemS Nml Nml Nml Nml Nml Nml Nml Nml Nml Nml Nml Nml N/A  Left 1stDorInt Nml Nml Nml Nml Nml Nml Nml Nml Nml Nml Nml Nml N/A  Left Ext Indicis Nml Nml Nml Nml Nml Nml Nml Nml Nml Nml Nml Nml N/A  Left PronatorTeres Nml Nml Nml Nml Nml Nml Nml Nml Nml Nml Nml Nml N/A  Left Biceps Nml Nml Nml Nml Nml Nml Nml Nml Nml Nml Nml Nml N/A  Left Triceps Nml Nml Nml Nml Nml Nml Nml Nml Nml Nml Nml Nml N/A  Left Deltoid Nml Nml Nml Nml Nml Nml Nml Nml Nml Nml Nml Nml N/A      Waveforms:

## 2015-03-15 ENCOUNTER — Telehealth: Payer: Self-pay | Admitting: *Deleted

## 2015-03-15 NOTE — Telephone Encounter (Signed)
Patient is aware Both labs and EMG show no evidence of muscle or nerve disease.

## 2015-03-15 NOTE — Telephone Encounter (Signed)
-----   Message from Drema DallasAdam R Jaffe, DO sent at 03/15/2015  6:30 AM EDT ----- Both labs and EMG show no evidence of muscle or nerve disease.  ----- Message -----    From: [place] Chardonika K Patel, DO    Sent: 03/14/2015   4:02 PM      To: Drema DallasAdam R Jaffe, DO

## 2015-10-22 ENCOUNTER — Emergency Department
Admission: EM | Admit: 2015-10-22 | Discharge: 2015-10-22 | Disposition: A | Discharge status: Home / Self Care | Payer: Commercial Managed Care - HMO | Source: Home / Self Care | Attending: Family Medicine | Admitting: Family Medicine

## 2015-10-22 ENCOUNTER — Encounter: Payer: Self-pay | Admitting: Emergency Medicine

## 2015-10-22 DIAGNOSIS — H6502 Acute serous otitis media, left ear: Secondary | ICD-10-CM | POA: Diagnosis not present

## 2015-10-22 DIAGNOSIS — H6092 Unspecified otitis externa, left ear: Secondary | ICD-10-CM | POA: Diagnosis not present

## 2015-10-22 DIAGNOSIS — B001 Herpesviral vesicular dermatitis: Secondary | ICD-10-CM | POA: Diagnosis not present

## 2015-10-22 MED ORDER — AMOXICILLIN-POT CLAVULANATE 875-125 MG PO TABS: 1.0000 | ORAL_TABLET | Freq: Two times a day (BID) | ORAL | Status: DC

## 2015-10-22 MED ORDER — VALACYCLOVIR HCL 500 MG PO TABS
ORAL_TABLET | ORAL | Status: DC
Start: 2015-10-22 — End: 2016-08-25

## 2015-10-22 MED ORDER — NEOMYCIN-POLYMYXIN-HC 3.5-10000-1 OT SUSP: 4.0000 [drp] | Freq: Four times a day (QID) | OTIC | Status: DC

## 2015-10-22 NOTE — ED Notes (Signed)
Patient C/O pain in left ear times 4 days ear is swollen with what appears to be pus present. Patient has been out of the country [place]

## 2015-10-22 NOTE — ED Provider Notes (Signed)
CSN: [id number]     Arrival date & time 10/22/15  1127 History   First MD Initiated Contact with Patient 10/22/15 1139     Chief Complaint  Patient presents with  . Otalgia   (Consider location/radiation/quality/duration/timing/severity/associated sxs/prior Treatment) HPI  Pt is a 24yo female [facility] with c/o gradually worsening Left ear pain with swelling and drainage for 4 days.  Pt also reports developing cold sores on her left lower lip around the same time. Pt has a hx of recurrent cold sores.  Pt states she has been out of the country on a cruise in the water in [place] and had also been swimming on the ship.  She has tried ibuprofen with minimal relief. Denies fever, chills, sore throat or cough or congestion. Denies sick contacts.  Past Medical History  Diagnosis Date  . GERD (gastroesophageal reflux disease)    Past Surgical History  Procedure Laterality Date  . Tonsillectomy and adenoidectomy     Family History  Problem Relation Age of Onset  . Arthritis Mother   . Arthritis Father   . Diabetes Father   . Arthritis Maternal Grandmother   . Arthritis Maternal Grandfather   . Diabetes Paternal Grandmother    Social History  Substance Use Topics  . Smoking status: Never Smoker   . Smokeless tobacco: None  . Alcohol Use: 0.0 oz/week    0 Standard drinks or equivalent per week     Comment: socially    OB History    No data available     Review of Systems  Constitutional: Negative for fever and chills.  HENT: Positive for congestion, ear discharge, ear pain (Left) and facial swelling. Negative for postnasal drip, rhinorrhea, sore throat, trouble swallowing and voice change.   Respiratory: Negative for cough and shortness of breath.   Gastrointestinal: Negative for nausea, vomiting, abdominal pain and diarrhea.    Allergies  Review of patient's allergies indicates no known allergies.  Home Medications   Prior to Admission medications     Medication Sig Start Date End Date Taking? Authorizing Provider  cholecalciferol (VITAMIN D) 1000 UNITS tablet Take 1,000 Units by mouth daily.   Yes Historical Provider, MD  amoxicillin-clavulanate (AUGMENTIN) 875-125 MG tablet Take 1 tablet by mouth 2 (two) times daily. One po bid x 10 days 10/22/15   Junius FinnerErin O'Malley, PA-C  diclofenac sodium (VOLTAREN) 1 % GEL Apply 2 g topically 3 (three) times daily as needed. Patient not taking: Reported on 03/04/2015 11/30/14   Myrlene BrokerElizabeth A Crawford, MD  lidocaine (LIDODERM) 5 % Place 1 patch onto the skin daily. Remove & Discard patch within 12 hours or as directed by MD Patient not taking: Reported on 03/04/2015 12/10/14   Myrlene BrokerElizabeth A Crawford, MD  meclizine (ANTIVERT) 25 MG tablet Take 1 tablet (25 mg total) by mouth 3 (three) times daily as needed for dizziness or nausea. Patient not taking: Reported on 03/04/2015 12/20/14   Loren Raceravid Yelverton, MD  neomycin-polymyxin-hydrocortisone (CORTISPORIN) 3.5-10000-1 otic suspension Place 4 drops into the left ear 4 (four) times daily. X 7 days 10/22/15   Junius FinnerErin O'Malley, PA-C  ondansetron (ZOFRAN ODT) 4 MG disintegrating tablet 4mg  ODT q4 hours prn nausea/vomit Patient not taking: Reported on 03/04/2015 12/20/14   Loren Raceravid Yelverton, MD  valACYclovir (VALTREX) 500 MG tablet Take 2 tabs (1g) Twice daily One time per episode of cold sores 10/22/15   Junius FinnerErin O'Malley, PA-C   Meds Ordered and Administered this Visit  Medications - No data to  display  BP 134/86 mmHg  Pulse 83  Temp(Src) 98.6 F (37 C) (Oral)  Ht  (1.6 m)  Wt 222 lb (100.699 kg)  BMI 39.34 kg/m2  SpO2 99%  LMP 10/01/2015 No data found.   Physical Exam  Constitutional: She appears well-developed and well-nourished. No distress.  HENT:  Head: Normocephalic and atraumatic.  Right Ear: Hearing, tympanic membrane, external ear and ear canal normal.  Left Ear: There is drainage ( scant white discharge), swelling ( within ear canal) and tenderness. No mastoid  tenderness. Tympanic membrane is erythematous and bulging. A middle ear effusion is present.  Nose: Mucosal edema present.  Mouth/Throat: Uvula is midline, oropharynx is clear and moist and mucous membranes are normal. Oral lesions present.    Left ear: mild erythema of external ear with dried skin. No mastoid tenderness.   Small group of yellow erythematous vesicles on Left lower lip. No bleeding or discharge.   Eyes: Conjunctivae are normal. No scleral icterus.  Neck: Normal range of motion. Neck supple.  Cardiovascular: Normal rate, regular rhythm and normal heart sounds.   Pulmonary/Chest: Effort normal and breath sounds normal. No respiratory distress. She has no wheezes. She has no rales. She exhibits no tenderness.  Abdominal: Soft. Bowel sounds are normal. She exhibits no distension and no mass. There is no tenderness. There is no rebound and no guarding.  Musculoskeletal: Normal range of motion.  Lymphadenopathy:    She has cervical adenopathy (anterior, larger on Left side).  Neurological: She is alert.  Skin: Skin is warm and dry. She is not diaphoretic.  Nursing note and vitals reviewed.   ED Course  Procedures (including critical care time)  Labs Review Labs Reviewed - No data to display  Imaging Review No results found.    MDM   1. Acute serous otitis media of left ear, recurrence not specified   2. Left otitis externa   3. Recurrent cold sores    Pt c/o Left ear pain for 4 days after swimming in the [place] while on a cruise.  Exam c/w AOM and acute otitis externa of Left ear.  No tenderness over mastoid.  Pt is afebrile, non-toxic appearing. Doubt mastoiditis at this time. Pt also has cold sores on Left lower lip. Hx of same. Pt has been on Valtrex before for them but does not have any at this time.  Rx: Augmentin, Cortisporin, and Valtrex. Advised pt to use acetaminophen and ibuprofen as needed for fever and pain. Encouraged rest and fluids. F/u  with PCP in 4-5 days if not improving, sooner if worsening. Pt verbalized understanding and agreement with tx plan.     Junius Finner, PA-C 10/22/15 1320

## 2016-08-25 ENCOUNTER — Encounter (HOSPITAL_COMMUNITY): Payer: Self-pay | Admitting: Emergency Medicine

## 2016-08-25 ENCOUNTER — Emergency Department (HOSPITAL_COMMUNITY)
Admission: EM | Admit: 2016-08-25 | Discharge: 2016-08-25 | Disposition: A | Discharge status: Home / Self Care | Payer: Managed Care, Other (non HMO) | Attending: Emergency Medicine | Admitting: Emergency Medicine

## 2016-08-25 DIAGNOSIS — R251 Tremor, unspecified: Secondary | ICD-10-CM | POA: Diagnosis present

## 2016-08-25 DIAGNOSIS — F419 Anxiety disorder, unspecified: Secondary | ICD-10-CM

## 2016-08-25 LAB — CBC WITH DIFFERENTIAL/PLATELET
BASOS ABS: 0 10*3/uL (ref 0.0–0.1)
Basophils Relative: 0 %
EOS ABS: 0.1 10*3/uL (ref 0.0–0.7)
EOS PCT: 1 %
HCT: 38.9 % (ref 36.0–46.0)
Hemoglobin: 12.3 g/dL (ref 12.0–15.0)
Lymphocytes Relative: 27 %
Lymphs Abs: 2.2 10*3/uL (ref 0.7–4.0)
MCH: 24.2 pg — ABNORMAL LOW (ref 26.0–34.0)
MCHC: 31.6 g/dL (ref 30.0–36.0)
MCV: 76.6 fL — ABNORMAL LOW (ref 78.0–100.0)
MONO ABS: 0.8 10*3/uL (ref 0.1–1.0)
MONOS PCT: 10 %
Neutro Abs: 5.1 10*3/uL (ref 1.7–7.7)
Neutrophils Relative %: 62 %
PLATELETS: 379 10*3/uL (ref 150–400)
RBC: 5.08 MIL/uL (ref 3.87–5.11)
RDW: 15.4 % (ref 11.5–15.5)
WBC: 8.2 10*3/uL (ref 4.0–10.5)

## 2016-08-25 LAB — BASIC METABOLIC PANEL
Anion gap: 9 (ref 5–15)
BUN: 11 mg/dL (ref 6–20)
CALCIUM: 9.9 mg/dL (ref 8.9–10.3)
CO2: 25 mmol/L (ref 22–32)
CREATININE: 0.83 mg/dL (ref 0.44–1.00)
Chloride: 105 mmol/L (ref 101–111)
GFR calc Af Amer: >60 mL/min (ref >60)
GLUCOSE: 100 mg/dL — AB (ref 65–99)
Potassium: 4 mmol/L (ref 3.5–5.1)
Sodium: 139 mmol/L (ref 135–145)

## 2016-08-25 MED ORDER — LORAZEPAM 1 MG PO TABS
1.0000 mg | ORAL_TABLET | Freq: Once | ORAL | Status: AC
  Administered 2016-08-25: 1 mg via ORAL
  Filled 2016-08-25: qty 1

## 2016-08-25 MED ORDER — LORAZEPAM 1 MG PO TABS: 1.0000 mg | ORAL_TABLET | Freq: Three times a day (TID) | ORAL | 0 refills | Status: AC | PRN

## 2016-08-25 NOTE — ED Triage Notes (Signed)
Pt c/o tremors in head and neck and L arm at time. Pt's head and neck moving up and down repeatedly. When asked if just her head had a tremor pt sts "No, sometimes my arm does it too see?" and then begins to shake her arm. Pt very anxious in triage. Pt has appointment with neurologist in November but sts she can't wait for that anymore. A&Ox4 and ambulatory.

## 2016-08-25 NOTE — ED Notes (Signed)
Bed: [facility] Expected date:  Expected time:  Means of arrival:  Comments: 

## 2016-08-25 NOTE — Discharge Instructions (Signed)
Substance Abuse Treatment Programs ° °Intensive Outpatient Programs °[facility]     °[address]      °[place], [place]                   °[phone]      ° °[facility] °[address] °[place], [place] °[phone] ° °[facility]     °(Inpatient and outpatient)     °[address]           °[phone]   ° °[facility] °336-288-1484 (Suboxone and Methadone) ° °[address]      °[place], [place] [address]      °336-882-2125      ° °[address] °[place], [place] °852-3033 ° °[facility] (Outpatient/Inpatient, [facility])    °(insurance only) [phone]      °       °[facility] (Groups & Residential) °[place], [place] °336-389-1413 ° °   °[facility]     °[address]     °[place], [place]      °[phone]      ° °[facility] (for caregivers and family) °[address] °[place], [place] °336-299-4655 ° ° ° ° ° °Residential Treatment Programs °[facility]      °[address], [place], [place] [address]  °([phone]      ° °[facility] °[address] [place], [place] [address] °[phone] ° °[facility]        °336-248-8914      ° °[facility] °1-800-659-3381 ° °[facility]             °[address]                                         °[place], [place]                                                °877-615-2722 or 336-784-9470                              ° °[facility] [place] °[address] °[place], [address] °1.877.941.8954 ° °[facility]    °[address]      °[place], [place]     °[phone]      ° °[facility] °[address] °[place], [place] °336-285-9073 ° °[facility]   °[address]     °[place], [place] [address]     °336-899-1550      °Admissions: 8am-3pm M-F ° °[facility] °[address] °[place],  [place] °336-227-7417 ° °[facility]: Residential Program (90 Days)   °[place], [place]      °336-725-8389 or 800-758-6077    ° °[facility] °[place],  °(Walk in Hours over the weekend or by referral) ° °[facility] °[address], [place],  [address] °(336) [phone] ° °Crisis Mobile: [facility]:  1-877-626-1772 (for crisis response 24 hours a day) °[facility] Hotline:      1-800-256-2452 °Outpatient Psychiatry and Counseling ° °[facility]: Mobile Crisis   Management 24 hours:  1-877-626-1772 ° °[facility] sliding scale fee and walk in schedule: M-F 8am-12pm/1pm-3pm °[address]  °[place], [place] [address] °[phone] ° °[facility] °[address] °[place], [place] [address] °336-703-9650 ° °[facility] ([facility])- new patient walk-in appointments available Monday - Friday 8am -3pm.          °[address] °[place], [place] [address] °336-676-6840 or crisis line- 336-676-6905 ° °[facility]/ Intensive Outpatient Therapy Program °[address] °[place], [place] [address] °336-832-9804 ° °[facility]                  °Crisis Services      °[phone]      °[address]     °[place], [place] [address]                ° °[facility]   °[facility] °[phone] °[address] °[place], [place] [address] ° ° °[facility]          °[address]  °[place], [place] [address]       °([phone]) [phone] ° °[facility] °[address] °[place], [place] [address] °336-292-1510 ° °[facility]    °[address]    °[place], [place] [address]     °336-632-3505      ° °Parish McKinney, MD     °[address]     °[place] [address]     °336-282-1251     °  °[facility] °[address] °[place] [address] ° °[facility]     °[address]     °[place], [place]      °336-542-2076      ° °[facility] °Shamsher Ahluwalia, MD °[address] °[place], [place] [address] °[phone] ° °[facility]     °[address]     °[place], [place] [address]     °336-274-1237      ° °[facility] °[address] °[place], [place] [address] °336-854-4450 °Resources for Temporary Residential Assistance/Crisis Centers ° °DAY CENTERS °[facility] °M-F 8am-3pm   °[address] [place], [place] [address]   336-332-0824 °Services include: laundry, barbering, support groups, case management, phone  & computer access, showers, AA/NA mtgs, mental health/substance abuse nurse, job skills class, disability information, VA assistance, spiritual classes, etc.  ° °HOMELESS SHELTERS ° °[facility]     °[facility]   °[address], [place]     °336.271.5959       °       °[facility] (women and children)       °[address] °[place], [place] [address] °336-275-0820 °[email or web address] for application and process °Application Required ° °[facility]   °[address]    °[place] [address]     °[phone]       °             °[facility] °[address] °[place], [place] [address] °336.273.5572 °336-235-0363(schedule application appt.) °Application Required ° °[facility] (women only)    °[address]     °[place], [place] [address]     °336-884-1039      °  Intake starts 6pm daily °Need valid ID, SSC, & Police report °[facility] [place] °[address] °[place], [place] °336-881-5420 °Application Required ° °[facility] (men only)     °[address]      °[place], [place]     °[phone]      ° °[facility] °(Pregnant women only) °[address] °[place], [place] °336-275-0206 ° °[facility]      °[address]      °[place], [place] [address]     °336-722-9951      °       °[facility] °[address] °[place], [place] °336-723-1848 °90 day commitment/SA/Application process ° °[facility](men only)     °[address]     °[place], [place]     °[phone]       °Check-in at 7pm     °       °[facility] °[address] °[place], [place] [address] °336-248-6684 °Men/Women/Women and Children must be there by 7 pm ° °[facility] °[place], [place] °336-722-8721                ° °

## 2016-08-25 NOTE — ED Notes (Signed)
PA at bedside.

## 2016-08-25 NOTE — ED Provider Notes (Signed)
[facility]-EMERGENCY DEPT Provider Note   CSN: [id number] Arrival date & time: 08/25/16  1455     History   Chief Complaint Chief Complaint  Patient presents with  . Tremors    HPI Julia Weeks is a 25 y.o. female.  Julia Weeks is a 25 y.o. Female who presents to the emergency department complaining of bobbing in her head for the past 4 months. She reports she has been seen for tremors by a neurologist at [facility] recently who completed a work up and would like her to see a movement specialist. He suspects a non-organic cause of her tremor. She reports this had vomiting is been ongoing for many months and has caused her lots of stress. She is tearful. She is due to see the movement specialist in November reports she just can't wait anymore. She reports is very frustrating to not be able to control her neck. She has also seen a rheumatologist without concerning findings. She has had a MRI without concerning findings. She had normal TSH and free T4. She takes only Synthroid and vitamin D. She has taken nothing for treatment of her symptoms. She has chronic nystagmus since birth. She denies fevers, chest pain, shortness of breath, abdominal pain, nausea, vomiting, diarrhea, double vision, difficulty walking or rashes. Patient is currently on her menstrual cycle.   The history is provided by the patient, the spouse and medical records. No language interpreter was used.    Past Medical History:  Diagnosis Date  . GERD (gastroesophageal reflux disease)     Patient Active Problem List   Diagnosis Date Noted  . GERD (gastroesophageal reflux disease) 11/30/2014  . Neck pain 11/30/2014  . Obesity 11/30/2014  . Routine general medical examination at a health care facility 11/30/2014    Past Surgical History:  Procedure Laterality Date  . TONSILLECTOMY AND ADENOIDECTOMY      OB History    No data available       Home Medications    Prior to Admission medications   Medication  Sig Start Date End Date Taking? Authorizing Provider  levothyroxine (SYNTHROID, LEVOTHROID) 88 MCG tablet Take 88 mcg by mouth daily. 08/09/16  Yes Historical Provider, MD  Vitamin D, Ergocalciferol, (DRISDOL) 50000 units CAPS capsule Take 50,000 Units by mouth once a week. 04/05/16  Yes Historical Provider, MD  LORazepam (ATIVAN) 1 MG tablet Take 1 tablet (1 mg total) by mouth 3 (three) times daily as needed. 08/25/16   Everlene Farrier, PA-C    Family History Family History  Problem Relation Age of Onset  . Arthritis Mother   . Arthritis Father   . Diabetes Father   . Arthritis Maternal Grandmother   . Arthritis Maternal Grandfather   . Diabetes Paternal Grandmother     Social History Social History  Substance Use Topics  . Smoking status: Never Smoker  . Smokeless tobacco: Not on file  . Alcohol use 0.0 oz/week     Comment: socially      Allergies   Review of patient's allergies indicates no known allergies.   Review of Systems Review of Systems  Constitutional: Negative for chills and fever.  HENT: Negative for congestion and sore throat.   Eyes: Negative for visual disturbance.  Respiratory: Negative for cough and shortness of breath.   Cardiovascular: Negative for chest pain.  Gastrointestinal: Negative for abdominal pain, nausea and vomiting.  Genitourinary: Negative for dysuria.  Musculoskeletal: Negative for back pain and neck pain.  Skin: Negative for rash.  Neurological:  Positive for tremors. Negative for dizziness, weakness, light-headedness, numbness and headaches.     Physical Exam Updated Vital Signs BP 125/79 (BP Location: Left Arm)   Pulse 87   Temp 97.8 F (36.6 C) (Axillary)   Resp 18   Ht 5\' 3"  (1.6 m)   Wt 90.7 kg   LMP 08/25/2016   SpO2 99%   BMI 35.43 kg/m   Physical Exam  Constitutional: She is oriented to person, place, and time. She appears well-developed and well-nourished. No distress.  Nontoxic appearing.  HENT:  Head:  Normocephalic and atraumatic.  Right Ear: External ear normal.  Left Ear: External ear normal.  Mouth/Throat: Oropharynx is clear and moist.  Eyes: Conjunctivae are normal. Pupils are equal, round, and reactive to light. Right eye exhibits no discharge. Left eye exhibits no discharge.  Nystagmus present. Patient reports this is chronic.  Neck: Normal range of motion. Neck supple. No JVD present. No tracheal deviation present.  Cardiovascular: Normal rate, regular rhythm, normal heart sounds and intact distal pulses.  Exam reveals no gallop and no friction rub.   No murmur heard. Pulmonary/Chest: Effort normal and breath sounds normal. No stridor. No respiratory distress. She has no wheezes. She has no rales.  Abdominal: Soft. There is no tenderness. There is no guarding.  Musculoskeletal: Normal range of motion. She exhibits no edema or tenderness.  Lymphadenopathy:    She has no cervical adenopathy.  Neurological: She is alert and oriented to person, place, and time. No cranial nerve deficit. Coordination normal.  Patient is alert and oriented 3. She has a slow shake of her head that is distractible and not constant. While examining the patient she stops bobbing her head. Also while speaking with the patient she often stops shaking her head. No hand or arm tremors noted. Normal gait. Cranial nerves are intact. Vision is grossly intact. Speech is clear and coherent.  Skin: Skin is warm and dry. Capillary refill takes less than 2 seconds. No rash noted. She is not diaphoretic. No erythema. No pallor.  Psychiatric: She has a normal mood and affect. Her behavior is normal.  Nursing note and vitals reviewed.    ED Treatments / Results  Labs (all labs ordered are listed, but only abnormal results are displayed) Labs Reviewed  BASIC METABOLIC PANEL - Abnormal; Notable for the following:       Result Value   Glucose, Bld 100 (*)    All other components within normal limits  CBC WITH  DIFFERENTIAL/PLATELET - Abnormal; Notable for the following:    MCV 76.6 (*)    MCH 24.2 (*)    All other components within normal limits    EKG  EKG Interpretation None       Radiology No results found.  Procedures Procedures (including critical care time)  Medications Ordered in ED Medications  LORazepam (ATIVAN) tablet 1 mg (1 mg Oral Given 08/25/16 1650)     Initial Impression / Assessment and Plan / ED Course  I have reviewed the triage vital signs and the nursing notes.  Pertinent labs & imaging results that were available during my care of the patient were reviewed by me and considered in my medical decision making (see chart for details).  Clinical Course   This is a 25 y.o. Female who presents to the emergency department complaining of bobbing in her head for the past 4 months. She reports she has been seen for tremors by a neurologist at [facility] recently who completed a  work up and would like her to see a movement specialist. He suspects a non-organic cause of her tremor. She reports this had vomiting is been ongoing for many months and has caused her lots of stress. She is tearful. She is due to see the movement specialist in November reports she just can't wait anymore. She reports is very frustrating to not be able to control her neck. She has also seen a rheumatologist without concerning findings. She has had a MRI without concerning findings. She had normal TSH and free T4. She takes only Synthroid and vitamin D. No recent changes to her medications. On exam the patient is afebrile nontoxic appearing. She is a slow bobbing of her head back and forth that is distractible. She is not consistently shaking her head. When I examine her or when she is speaking she is usually not shaking her head. She is tearful and expresses that this has been causing her lots of stress. She has no focal neurological deficits on my exam. CBC and BMP are unremarkable. Patient is brighter  than milligram of by mouth Ativan. At recheck patient reports she is feeling much better. She is no longer having any head shaking or tremor. I agree with the neurologists assessment that this is likely a nonorganic cause. I suspect anxiety. I will provide her with a short course of Ativan to use at home until she can follow up with primary care. I did discuss precautions all taking Ativan. I discussed behavioral health resources and encouraged her to seek help with her stress. I also encouraged her to keep her appointment with the movement specialist. I advised the patient to follow-up with their primary care provider this week. I advised the patient to return to the emergency department with new or worsening symptoms or new concerns. The patient verbalized understanding and agreement with plan.     Final Clinical Impressions(s) / ED Diagnoses   Final diagnoses:  Tremor  Anxiety    New Prescriptions New Prescriptions   LORAZEPAM (ATIVAN) 1 MG TABLET    Take 1 tablet (1 mg total) by mouth 3 (three) times daily as needed.     Everlene FarrierWilliam Lakeria Starkman, PA-C 08/25/16 1832    Benjiman CoreNathan Pickering, MD 08/26/16 [phone]

## 2016-08-25 NOTE — ED Notes (Signed)
Before RN left room pt sts "Have you ever seen this before? I'm dying aren't I? I'M DYING!" Pt became hysterical, sobbing. Pt told to keep breathing, that her vital signs looked good. Pt's tremor noted to stop when speaking and crying.

## 2016-09-09 IMAGING — MR MR HEAD W/O CM
6 of 8 series · 29 of 48 positions shown · non-contrast
Comparison: None.

CLINICAL DATA: LEFT scapular pain. LEFT-sided headaches. Dizziness
and confusion for 2 months.

EXAM:
MRI HEAD WITHOUT CONTRAST
MRI CERVICAL SPINE WITHOUT CONTRAST
TECHNIQUE: Multiplanar, multiecho pulse sequences of the brain and surrounding
structures, and cervical spine, to include the craniocervical
junction and cervicothoracic junction, were obtained without
intravenous contrast.

[Series 3: FLAIR · sagittal · 5.0mm · 0.47mm/px · 3 of 27 slices shown (1 of 2)]
[im 1/27]
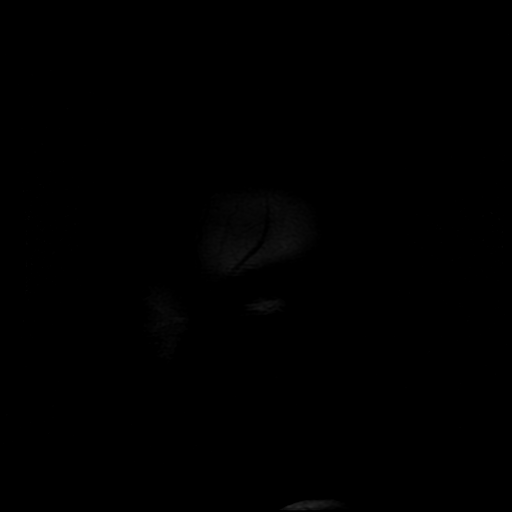
[im 14/27]
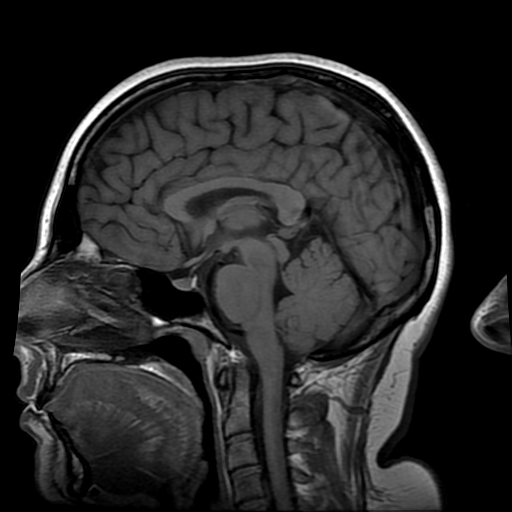
[im 27/27]
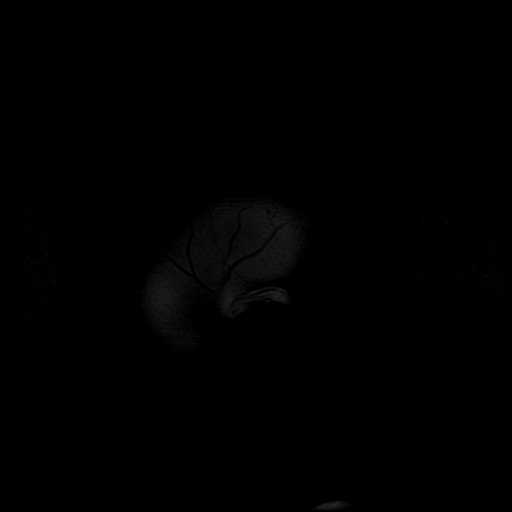

[Series 4: DWI · axial · 3.0mm · 1.09mm/px · z∈[-48,+102]mm · 8 of 102 slices shown (1 of 2)]
[im 1/102]
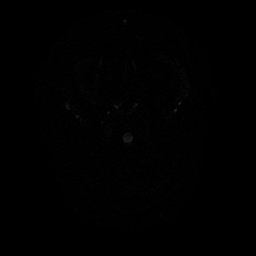
[im 16/102]
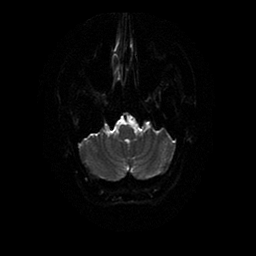
[im 32/102]
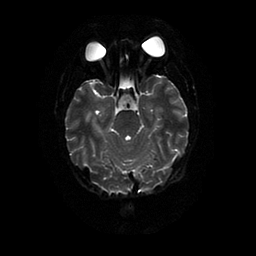
[im 47/102]
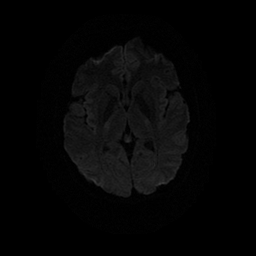
[im 55/102]
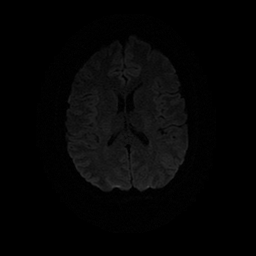
[im 70/102]
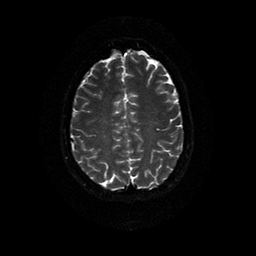
[im 86/102]
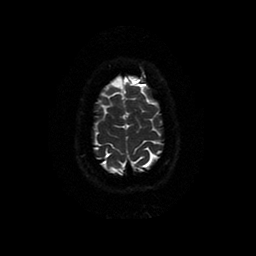
[im 102/102]
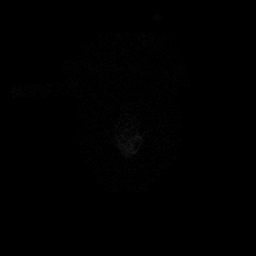

[Series 5: T2-star · axial · 5.0mm · 0.43mm/px · z∈[-59,+37]mm · 3 of 26 slices shown]
[im 1/26]
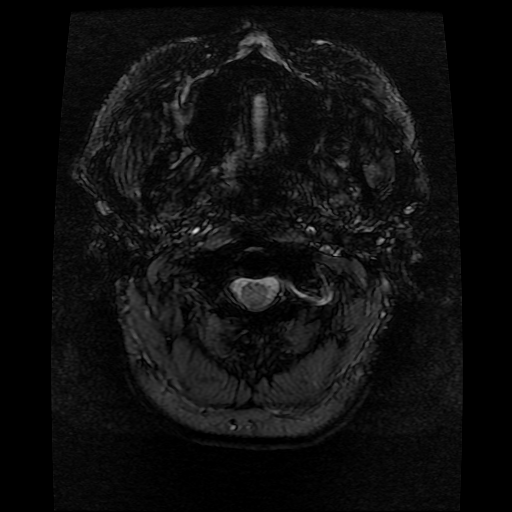
[im 9/26]
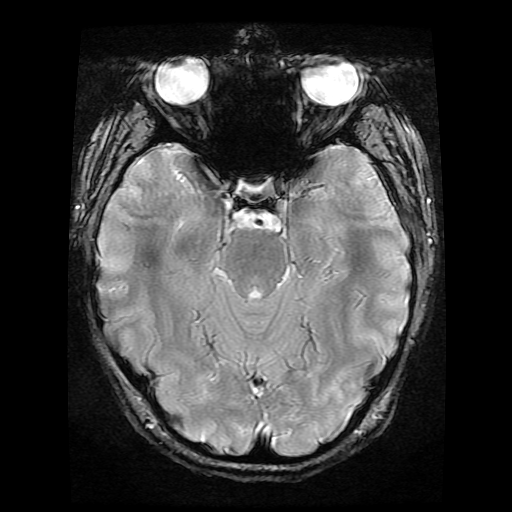
[im 17/26]
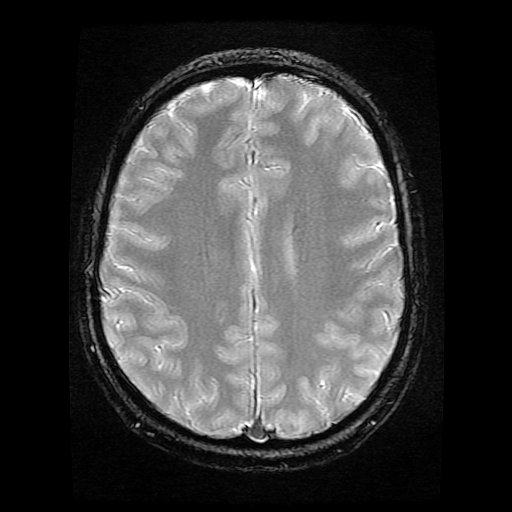

[Series 7: FLAIR · axial · 5.0mm · 0.43mm/px · z∈[-59,+91]mm · 4 of 26 slices shown (2 of 2)]
[im 1/26]
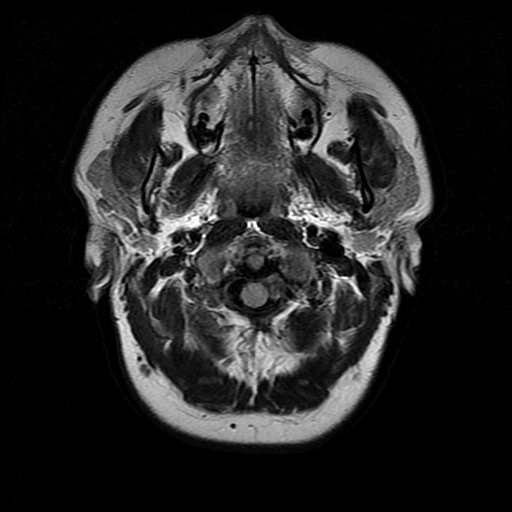
[im 9/26]
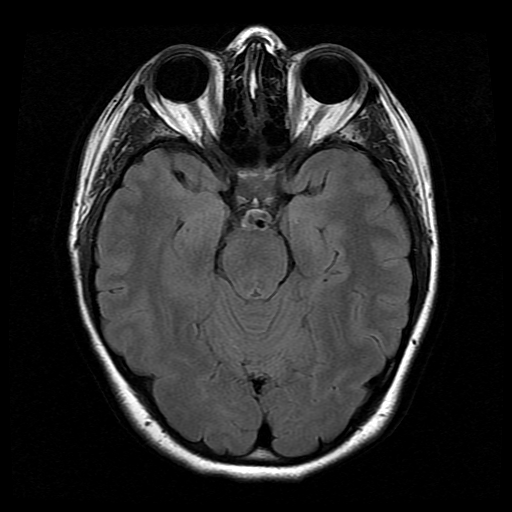
[im 17/26]
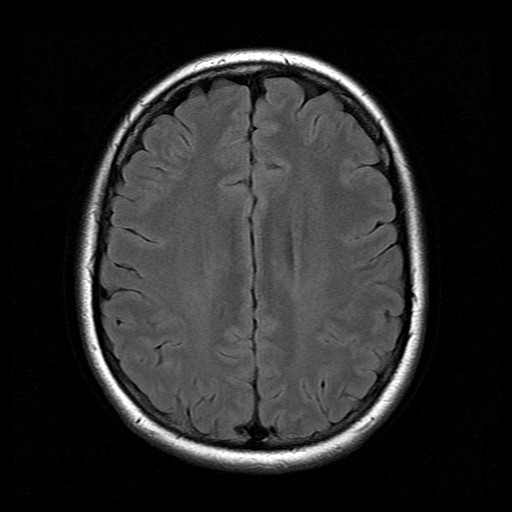
[im 26/26]
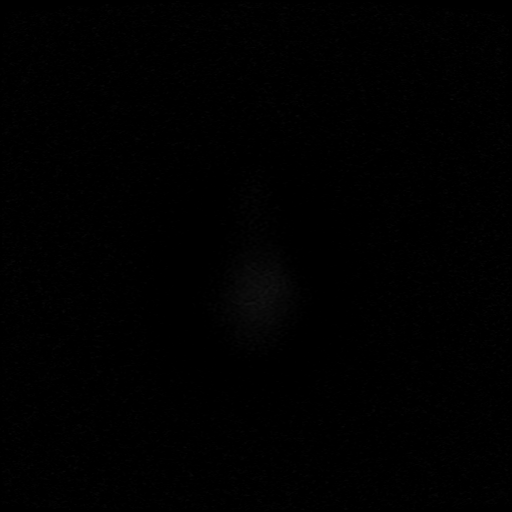

[Series 9: T2 · coronal · 5.0mm · 0.43mm/px · 4 of 30 slices shown]
[im 1/30]
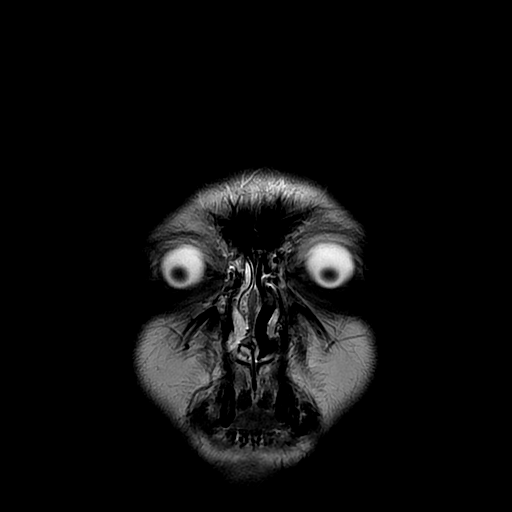
[im 10/30]
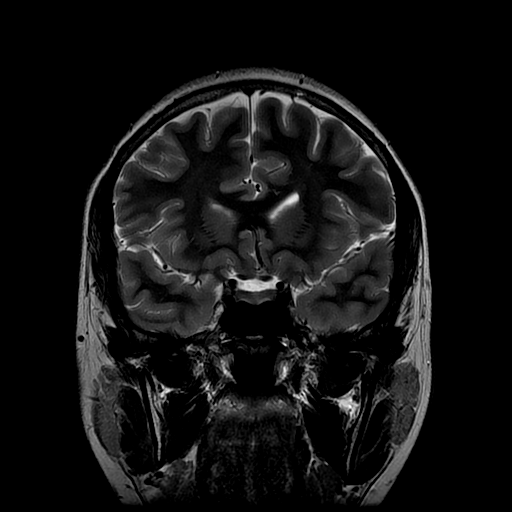
[im 20/30]
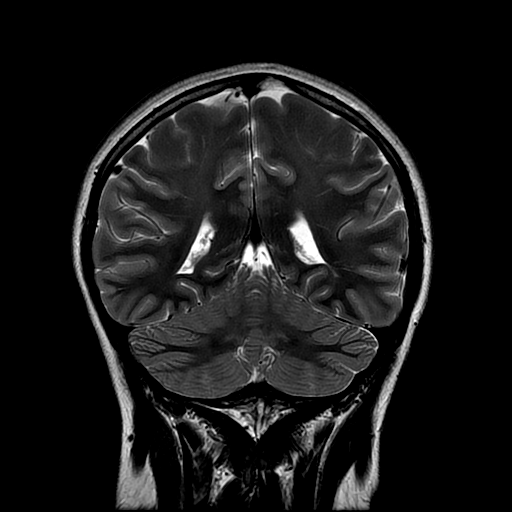
[im 30/30]
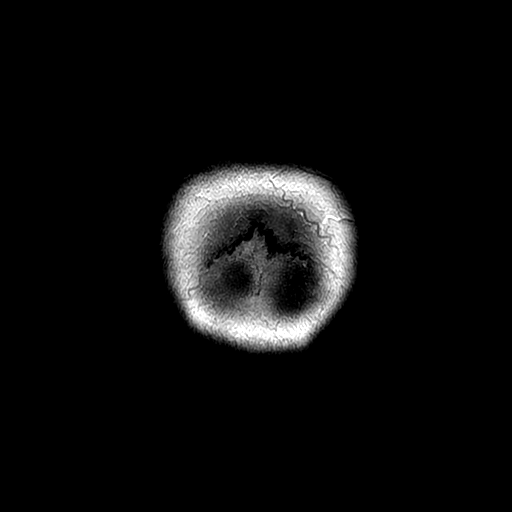

[Series 400: DWI · axial · 3.0mm · 1.09mm/px · z∈[-48,+102]mm · 7 of 51 slices shown (2 of 2)]
[im 1/51]
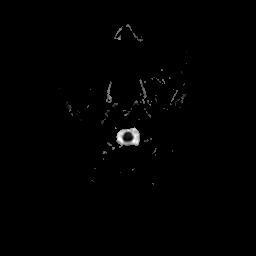
[im 9/51]
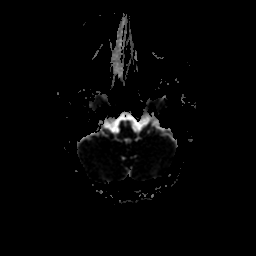
[im 17/51]
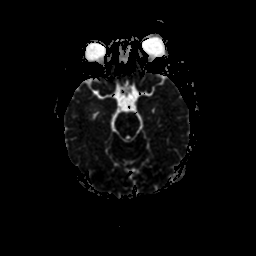
[im 26/51]
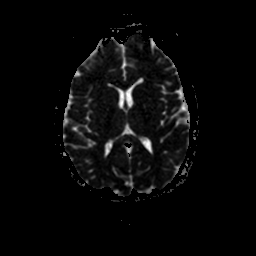
[im 34/51]
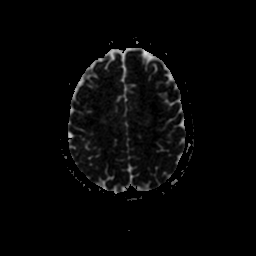
[im 42/51]
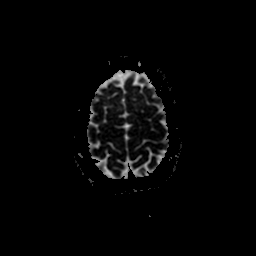
[im 51/51]
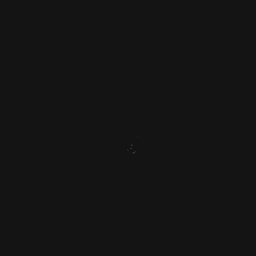

[29 of 48 positions shown; findings below may reference images not displayed]

FINDINGS: MRI HEAD FINDINGS

No evidence for acute infarction, hemorrhage, mass lesion,
hydrocephalus, or extra-axial fluid. There is no atrophy or white
matter disease. Prominent perivascular spaces are normal in this
patient. Pituitary, pineal, and cerebellar tonsils unremarkable.
Flow voids are maintained throughout the carotid, basilar, and LEFT
vertebral arteries. Diminutive RIGHT vertebral artery appears to
supply only PICA.

There are no areas of chronic hemorrhage. Visualized calvarium, and
skull base unremarkable. Scalp and extracranial soft tissues,
orbits, sinuses, and mastoids show no acute process.

MRI CERVICAL SPINE FINDINGS

Alignment: Normal.

Vertebrae: Normal.

Cord: Normal.

Vertebral Arteries: BILATERAL patent, LEFT dominant.

Paraspinal tissues: Normal.

Disc levels:

The individual disc spaces were examined as follows:

C2-3:  Normal.

C3-4:  Normal.

C4-5: Central disc extrusion. Minimal eccentricity to the LEFT.
Slight effacement anterior subarachnoid space. No cord compression.
No foraminal narrowing or definite LEFT C5 nerve root impingement.

C5-6:  Normal.

C6-7:  Normal.

C7-T1:  Normal.
IMPRESSION: Normal MRI of the brain without contrast.

Central and minimally eccentric to the LEFT disc extrusion at C4-C5.
No cord compression or C5 nerve root impingement is evident.

## 2020-04-25 ENCOUNTER — Emergency Department (INDEPENDENT_AMBULATORY_CARE_PROVIDER_SITE_OTHER)
Admission: EM | Admit: 2020-04-25 | Discharge: 2020-04-25 | Disposition: A | Discharge status: Home / Self Care | Payer: 59 | Source: Home / Self Care

## 2020-04-25 DIAGNOSIS — S61213A Laceration without foreign body of left middle finger without damage to nail, initial encounter: Secondary | ICD-10-CM

## 2020-04-25 DIAGNOSIS — S61012A Laceration without foreign body of left thumb without damage to nail, initial encounter: Secondary | ICD-10-CM

## 2020-04-25 NOTE — ED Provider Notes (Signed)
[facility]    CSN: [id number] Arrival date & time: 04/25/20  1258      History   Chief Complaint Chief Complaint  Patient presents with  . Laceration    HPI Julia Weeks is a 29 y.o. female.   HPI  Julia Weeks is a 29 y.o. female presenting to UC with c/o laceration to her Left thumb and Left middle finger about 1 hour PTA. Pt cut her fingers with a knife while cutting up potatoes for dinner tonight. Bleeding controlled PTA. Pain is 4/10. Denies numbness or weakness. No medication taken PTA. She is not on blood thinners. She is Right hand dominant.     Past Medical History:  Diagnosis Date  . GERD (gastroesophageal reflux disease)     Patient Active Problem List   Diagnosis Date Noted  . GERD (gastroesophageal reflux disease) 11/30/2014  . Neck pain 11/30/2014  . Obesity 11/30/2014  . Routine general medical examination at a health care facility 11/30/2014    Past Surgical History:  Procedure Laterality Date  . TONSILLECTOMY AND ADENOIDECTOMY      OB History   No obstetric history on file.      Home Medications    Prior to Admission medications   Medication Sig Start Date End Date Taking? Authorizing Provider  levothyroxine (SYNTHROID, LEVOTHROID) 88 MCG tablet Take 88 mcg by mouth daily. 08/09/16   [provider]  LORazepam (ATIVAN) 1 MG tablet Take 1 tablet (1 mg total) by mouth 3 (three) times daily as needed. 08/25/16   Waynetta Pean, PA-C  Vitamin D, Ergocalciferol, (DRISDOL) 50000 units CAPS capsule Take 50,000 Units by mouth once a week. 04/05/16   [provider]    Family History Family History  Problem Relation Age of Onset  . Arthritis Mother   . Arthritis Father   . Diabetes Father   . Arthritis Maternal Grandmother   . Arthritis Maternal Grandfather   . Diabetes Paternal Grandmother     Social History Social History   Tobacco Use  . Smoking status: Never Smoker  . Smokeless tobacco: Never Used    Substance Use Topics  . Alcohol use: Yes    Alcohol/week: 0.0 standard drinks    Comment: socially   . Drug use: No     Allergies   Patient has no known allergies.   Review of Systems Review of Systems  Musculoskeletal: Negative for arthralgias and joint swelling.  Skin: Positive for wound. Negative for color change.  Neurological: Negative for weakness and numbness.     Physical Exam Triage Vital Signs ED Triage Vitals  Enc Vitals Group     BP 04/25/20 1322 121/84     Pulse Rate 04/25/20 1322 82     Resp 04/25/20 1322 18     Temp 04/25/20 1322 98.4 F (36.9 C)     Temp Source 04/25/20 1322 Oral     SpO2 04/25/20 1322 100 %     Weight --      Height --      Head Circumference --      Peak Flow --      Pain Score 04/25/20 1321 4     Pain Loc --      Pain Edu? --      Excl. in [place]? --    No data found.  Updated Vital Signs BP 121/84 (BP Location: Right Arm)   Pulse 82   Temp 98.4 F (36.9 C) (Oral)   Resp 18  SpO2 100%   Visual Acuity Right Eye Distance:   Left Eye Distance:   Bilateral Distance:    Right Eye Near:   Left Eye Near:    Bilateral Near:     Physical Exam Vitals and nursing note reviewed.  Constitutional:      Appearance: Normal appearance. She is well-developed.  HENT:     Head: Normocephalic and atraumatic.  Cardiovascular:     Rate and Rhythm: Normal rate.  Pulmonary:     Effort: Pulmonary effort is normal.  Musculoskeletal:        General: No swelling. Normal range of motion.       Hands:     Cervical back: Normal range of motion.     Comments: Left thumb: mild tenderness around laceration along ulnar aspect of PIP joint. Full ROM Left middle finger: full ROM, superficial "V" shaped skin flap laceration dorsal aspect of distal phalanx.  Skin:    General: Skin is warm and dry.     Capillary Refill: Capillary refill takes less than 2 seconds.  Neurological:     Mental Status: She is alert and oriented to person, place, and  time.     Sensory: No sensory deficit.  Psychiatric:        Behavior: Behavior normal.      UC Treatments / Results  Labs (all labs ordered are listed, but only abnormal results are displayed) Labs Reviewed - No data to display  EKG   Radiology No results found.  Procedures Laceration Repair  Date/Time: 04/25/2020 2:33 PM Performed by: Lurene Shadow, PA-C Authorized by: Lurene Shadow, PA-C   Consent:    Consent obtained:  Verbal   Consent given by:  Patient   Risks discussed:  Infection, pain, poor wound healing and nerve damage   Alternatives discussed:  No treatment and delayed treatment Anesthesia (see MAR for exact dosages):    Anesthesia method:  Nerve block   Block location:  Left thumb   Block needle gauge:  25 G   Block anesthetic:  Lidocaine 2% w/o epi   Block technique:  Digital   Block injection procedure:  Introduced needle, anatomic landmarks identified, anatomic landmarks palpated, negative aspiration for blood and incremental injection   Block outcome:  Anesthesia achieved Laceration details:    Location:  Finger   Finger location:  L thumb   Length (cm):  2   Depth (mm):  3 Repair type:    Repair type:  Simple Pre-procedure details:    Preparation:  Patient was prepped and draped in usual sterile fashion Exploration:    Hemostasis achieved with:  Direct pressure   Wound exploration: wound explored through full range of motion and entire depth of wound probed and visualized     Wound extent: no areolar tissue violation noted, no fascia violation noted, no foreign bodies/material noted, no muscle damage noted, no nerve damage noted, no tendon damage noted, no underlying fracture noted and no vascular damage noted     Contaminated: no   Treatment:    Area cleansed with:  Saline   Amount of cleaning:  Standard   Irrigation solution:  Sterile water Skin repair:    Repair method:  Sutures   Suture size:  4-0   Suture material:  Prolene   Suture  technique:  Simple interrupted   Number of sutures:  2 Approximation:    Approximation:  Close Post-procedure details:    Dressing:  Bulky dressing   Patient tolerance of  procedure:  Tolerated well, no immediate complications Laceration Repair  Date/Time: 04/25/2020 2:34 PM Performed by: Lurene Shadow, PA-C Authorized by: Lurene Shadow, PA-C   Consent:    Consent obtained:  Verbal   Consent given by:  Patient   Risks discussed:  Infection, pain, poor wound healing and nerve damage   Alternatives discussed:  No treatment Anesthesia (see MAR for exact dosages):    Anesthesia method:  None Laceration details:    Location:  Finger   Finger location:  L long finger   Length (cm):  1   Depth (mm):  2 Repair type:    Repair type:  Simple Pre-procedure details:    Preparation:  Patient was prepped and draped in usual sterile fashion Exploration:    Hemostasis achieved with:  Direct pressure   Wound exploration: wound explored through full range of motion     Wound extent: no areolar tissue violation noted, no fascia violation noted, no foreign bodies/material noted, no muscle damage noted, no nerve damage noted, no tendon damage noted, no underlying fracture noted and no vascular damage noted     Contaminated: no   Treatment:    Area cleansed with:  Saline   Amount of cleaning:  Standard   Irrigation solution:  Sterile water Skin repair:    Repair method:  Steri-Strips   Number of Steri-Strips:  1 Approximation:    Approximation:  Close Post-procedure details:    Dressing:  Non-adherent dressing   Patient tolerance of procedure:  Tolerated well, no immediate complications   (including critical care time)  Medications Ordered in UC Medications - No data to display  Initial Impression / Assessment and Plan / UC Course  I have reviewed the triage vital signs and the nursing notes.  Pertinent labs & imaging results that were available during my care of the patient were  reviewed by me and considered in my medical decision making (see chart for details).     Wounds closed as noted above  F/u 10-14 days for suture removal AVS provided  Final Clinical Impressions(s) / UC Diagnoses   Final diagnoses:  Laceration of left thumb without foreign body without damage to nail, initial encounter  Laceration of left middle finger without damage to nail, initial encounter     Discharge Instructions      Keep bandage on thumb for 24-48 hours. Gently remove and clean with warm water and mild soap, pat dry. Apply a new clean dry bandage to keep wound protected as it heals. Change bandage 1-2 times daily as needed.   Follow up in 10-14 days with urgent care or your family doctor for removal of sutures. Return sooner if concern for infection- increased pain, redness, swelling, drainage of pus.     ED Prescriptions    None     PDMP not reviewed this encounter.   Lurene Shadow, [place] 04/25/20 1437

## 2020-04-25 NOTE — Discharge Instructions (Signed)
  Keep bandage on thumb for 24-48 hours. Gently remove and clean with warm water and mild soap, pat dry. Apply a new clean dry bandage to keep wound protected as it heals. Change bandage 1-2 times daily as needed.   Follow up in 10-14 days with urgent care or your family doctor for removal of sutures. Return sooner if concern for infection- increased pain, redness, swelling, drainage of pus.

## 2020-04-25 NOTE — ED Triage Notes (Signed)
Patient presents to Urgent Care with complaints of laceration to thumb and middle fingers since about an hour ago. Patient reports she is up to date on her tetanus shot.

## 2020-05-05 ENCOUNTER — Emergency Department
Admission: EM | Admit: 2020-05-05 | Discharge: 2020-05-05 | Disposition: A | Discharge status: Home / Self Care | Payer: 59 | Source: Home / Self Care

## 2020-05-05 ENCOUNTER — Encounter: Payer: Self-pay | Admitting: Emergency Medicine

## 2020-05-05 ENCOUNTER — Other Ambulatory Visit: Payer: Self-pay

## 2020-05-05 NOTE — ED Triage Notes (Signed)
Remove 2 sutures from LT thumb

## 2022-08-07 ENCOUNTER — Encounter: Payer: Self-pay | Admitting: Emergency Medicine

## 2022-08-07 ENCOUNTER — Ambulatory Visit
Admission: EM | Admit: 2022-08-07 | Discharge: 2022-08-07 | Disposition: A | Discharge status: Home / Self Care | Payer: 59 | Attending: Family Medicine | Admitting: Family Medicine

## 2022-08-07 DIAGNOSIS — H6503 Acute serous otitis media, bilateral: Secondary | ICD-10-CM

## 2022-08-07 DIAGNOSIS — H6123 Impacted cerumen, bilateral: Secondary | ICD-10-CM | POA: Diagnosis not present

## 2022-08-07 MED ORDER — AMOXICILLIN-POT CLAVULANATE 875-125 MG PO TABS: 1.0000 | ORAL_TABLET | Freq: Two times a day (BID) | ORAL | 0 refills | Status: AC

## 2022-08-07 NOTE — ED Provider Notes (Signed)
[facility]    CSN: [id number] Arrival date & time: 08/07/22  1911      History   Chief Complaint Chief Complaint  Patient presents with   Otalgia    HPI Julia Weeks is a 31 y.o. female.   HPI Pleasant 31 year old female presents with bilateral ear discomfort. Reports more pain on the right ear for the past 2 days.  PMH significant for obesity and GERD.  Past Medical History:  Diagnosis Date   GERD (gastroesophageal reflux disease)     Patient Active Problem List   Diagnosis Date Noted   GERD (gastroesophageal reflux disease) 11/30/2014   Neck pain 11/30/2014   Obesity 11/30/2014   Routine general medical examination at a health care facility 11/30/2014    Past Surgical History:  Procedure Laterality Date   TONSILLECTOMY AND ADENOIDECTOMY      OB History   No obstetric history on file.      Home Medications    Prior to Admission medications   Medication Sig Start Date End Date Taking? Authorizing Provider  amoxicillin-clavulanate (AUGMENTIN) 875-125 MG tablet Take 1 tablet by mouth 2 (two) times daily for 7 days. 08/07/22 08/14/22 Yes Trevor Iha, FNP  levothyroxine (SYNTHROID, LEVOTHROID) 88 MCG tablet Take 88 mcg by mouth daily. 08/09/16  Yes [provider]  Vitamin D, Ergocalciferol, (DRISDOL) 50000 units CAPS capsule Take 50,000 Units by mouth once a week. 04/05/16  Yes [provider]  LORazepam (ATIVAN) 1 MG tablet Take 1 tablet (1 mg total) by mouth 3 (three) times daily as needed. 08/25/16   Everlene Farrier, PA-C    Family History Family History  Problem Relation Age of Onset   Arthritis Mother    Arthritis Father    Diabetes Father    Arthritis Maternal Grandmother    Arthritis Maternal Grandfather    Diabetes Paternal Grandmother     Social History Social History   Tobacco Use   Smoking status: Never   Smokeless tobacco: Never  Substance Use Topics   Alcohol use: Yes    Alcohol/week: 0.0 standard drinks of  alcohol    Comment: socially    Drug use: No     Allergies   Patient has no known allergies.   Review of Systems Review of Systems  HENT:  Positive for ear pain.      Physical Exam Triage Vital Signs ED Triage Vitals  Enc Vitals Group     BP 08/07/22 1921 124/81     Pulse Rate 08/07/22 1921 81     Resp 08/07/22 1921 18     Temp 08/07/22 1921 98.6 F (37 C)     Temp Source 08/07/22 1921 Oral     SpO2 08/07/22 1921 97 %     Weight 08/07/22 1922 190 lb (86.2 kg)     Height 08/07/22 1922 5\' 2"  (1.575 m)     Head Circumference --      Peak Flow --      Pain Score 08/07/22 1922 2     Pain Loc --      Pain Edu? --      Excl. in [place]? --    No data found.  Updated Vital Signs BP 124/81 (BP Location: Right Arm)   Pulse 81   Temp 98.6 F (37 C) (Oral)   Resp 18   Ht 5\' 2"  (1.575 m)   Wt 190 lb (86.2 kg)   LMP 08/02/2022 (Exact Date)   SpO2 97%   BMI  34.75 kg/m    Physical Exam Vitals and nursing note reviewed.  Constitutional:      General: She is not in acute distress.    Appearance: Normal appearance. She is obese. She is not ill-appearing.  HENT:     Head: Normocephalic and atraumatic.     Right Ear: External ear normal.     Left Ear: External ear normal.     Ears:     Comments: Bilateral EACs occluded with excessive cerumen unable to visualize either TM; post bilateral ear lavage: left EAC-clear; left TM-erythematous, bulging; right EAC-clear; right TM-red rimmed    Mouth/Throat:     Mouth: Mucous membranes are dry.  Eyes:     Extraocular Movements: Extraocular movements intact.     Conjunctiva/sclera: Conjunctivae normal.     Pupils: Pupils are equal, round, and reactive to light.  Cardiovascular:     Rate and Rhythm: Normal rate and regular rhythm.     Pulses: Normal pulses.     Heart sounds: Normal heart sounds.  Pulmonary:     Effort: Pulmonary effort is normal.     Breath sounds: Normal breath sounds. No wheezing, rhonchi or rales.   Musculoskeletal:        General: Normal range of motion.     Cervical back: Normal range of motion and neck supple. No tenderness.  Lymphadenopathy:     Cervical: No cervical adenopathy.  Skin:    General: Skin is warm and dry.  Neurological:     General: No focal deficit present.     Mental Status: She is alert and oriented to person, place, and time.      UC Treatments / Results  Labs (all labs ordered are listed, but only abnormal results are displayed) Labs Reviewed - No data to display  EKG   Radiology No results found.  Procedures Procedures (including critical care time)  Medications Ordered in UC Medications - No data to display  Initial Impression / Assessment and Plan / UC Course  I have reviewed the triage vital signs and the nursing notes.  Pertinent labs & imaging results that were available during my care of the patient were reviewed by me and considered in my medical decision making (see chart for details).     MDM: 1.  Bilateral impacted cerumen-solved with bilateral ear lavage; 2.  Bilateral acute serous otitis media, recurrence not specified-Rx'd Augmentin. Advised patient to take medication as directed with food to completion.  Encouraged patient increase daily water intake while taking this medication.  Advised patient if symptoms worsen and/or unresolved please follow-up with PCP or here for further evaluation.  Patient discharged home, hemodynamically stable. Final Clinical Impressions(s) / UC Diagnoses   Final diagnoses:  Bilateral impacted cerumen  Bilateral acute serous otitis media, recurrence not specified     Discharge Instructions      Advised patient to take medication as directed with food to completion.  Encouraged patient increase daily water intake while taking this medication.  Advised patient if symptoms worsen and/or unresolved please follow-up with PCP or here for further evaluation.     ED Prescriptions     Medication  Sig Dispense Auth. Provider   amoxicillin-clavulanate (AUGMENTIN) 875-125 MG tablet Take 1 tablet by mouth 2 (two) times daily for 7 days. 14 tablet Trevor Iha, FNP      PDMP not reviewed this encounter.   Trevor Iha, FNP 08/07/22 1951

## 2022-08-07 NOTE — ED Triage Notes (Signed)
Patient c/o bilateral ear discomfort, more pain on the right x 2 days.  Patient denies any other sx's or taken any OTC pain meds.

## 2022-08-07 NOTE — Discharge Instructions (Addendum)
Advised patient to take medication as directed with food to completion.  Encouraged patient increase daily water intake while taking this medication.  Advised patient if symptoms worsen and/or unresolved please follow-up with PCP or here for further evaluation.
